# Patient Record
Sex: Female | Born: 1965
Health system: Southern US, Community
[De-identification: ages and names within clinical notes are randomized; demographics above are authoritative.]

## PROBLEM LIST (undated history)

## (undated) DIAGNOSIS — N809 Endometriosis, unspecified: Secondary | ICD-10-CM

## (undated) DIAGNOSIS — D649 Anemia, unspecified: Secondary | ICD-10-CM

## (undated) DIAGNOSIS — C569 Malignant neoplasm of unspecified ovary: Secondary | ICD-10-CM

## (undated) DIAGNOSIS — I10 Essential (primary) hypertension: Secondary | ICD-10-CM

## (undated) HISTORY — PX: ENDOMETRIAL BIOPSY: SHX622

## (undated) HISTORY — DX: Anemia, unspecified: D64.9

## (undated) HISTORY — PX: ABDOMINAL HYSTERECTOMY: SHX81

---

## 2000-10-24 ENCOUNTER — Other Ambulatory Visit: Admission: RE | Admit: 2000-10-24 | Discharge: 2000-10-24 | Payer: Self-pay | Admitting: Family Medicine

## 2004-05-05 ENCOUNTER — Emergency Department: Payer: Self-pay | Admitting: Unknown Physician Specialty

## 2005-06-14 ENCOUNTER — Ambulatory Visit: Payer: Self-pay | Admitting: Gastroenterology

## 2007-06-10 ENCOUNTER — Other Ambulatory Visit: Payer: Self-pay

## 2007-06-10 ENCOUNTER — Emergency Department: Payer: Self-pay | Admitting: Emergency Medicine

## 2009-12-06 ENCOUNTER — Emergency Department: Payer: Self-pay | Admitting: Emergency Medicine

## 2009-12-12 ENCOUNTER — Ambulatory Visit: Payer: Self-pay | Admitting: Family Medicine

## 2010-11-23 ENCOUNTER — Ambulatory Visit: Payer: Self-pay | Admitting: Family Medicine

## 2011-10-08 ENCOUNTER — Ambulatory Visit: Payer: Self-pay | Admitting: Family Medicine

## 2011-10-10 ENCOUNTER — Ambulatory Visit: Payer: Self-pay | Admitting: Family Medicine

## 2011-11-08 DIAGNOSIS — N806 Endometriosis in cutaneous scar: Secondary | ICD-10-CM | POA: Insufficient documentation

## 2012-05-28 DIAGNOSIS — K30 Functional dyspepsia: Secondary | ICD-10-CM | POA: Insufficient documentation

## 2012-05-28 DIAGNOSIS — Z9889 Other specified postprocedural states: Secondary | ICD-10-CM | POA: Insufficient documentation

## 2012-06-11 DIAGNOSIS — K5900 Constipation, unspecified: Secondary | ICD-10-CM | POA: Insufficient documentation

## 2012-07-07 DIAGNOSIS — C44509 Unspecified malignant neoplasm of skin of other part of trunk: Secondary | ICD-10-CM | POA: Insufficient documentation

## 2012-07-07 DIAGNOSIS — C482 Malignant neoplasm of peritoneum, unspecified: Secondary | ICD-10-CM | POA: Insufficient documentation

## 2013-05-28 LAB — LIPID PANEL
Cholesterol: 181 mg/dL (ref 0–200)
HDL: 49 mg/dL (ref 35–70)
LDL Cholesterol: 106 mg/dL
Triglycerides: 130 mg/dL (ref 40–160)

## 2013-05-28 LAB — BASIC METABOLIC PANEL
Creatinine: 0.9 mg/dL (ref 0.5–1.1)
Glucose: 108 mg/dL

## 2014-12-03 ENCOUNTER — Ambulatory Visit (INDEPENDENT_AMBULATORY_CARE_PROVIDER_SITE_OTHER): Payer: BLUE CROSS/BLUE SHIELD | Admitting: Family Medicine

## 2014-12-03 ENCOUNTER — Encounter: Payer: Self-pay | Admitting: Family Medicine

## 2014-12-03 VITALS — BP 112/82 | HR 93 | Temp 98.7°F | Resp 16 | Wt 222.6 lb

## 2014-12-03 DIAGNOSIS — M779 Enthesopathy, unspecified: Secondary | ICD-10-CM

## 2014-12-03 DIAGNOSIS — C569 Malignant neoplasm of unspecified ovary: Secondary | ICD-10-CM

## 2014-12-03 DIAGNOSIS — Z1231 Encounter for screening mammogram for malignant neoplasm of breast: Secondary | ICD-10-CM | POA: Diagnosis not present

## 2014-12-03 DIAGNOSIS — I10 Essential (primary) hypertension: Secondary | ICD-10-CM | POA: Diagnosis not present

## 2014-12-03 NOTE — Progress Notes (Signed)
Subjective:     Patient ID: Michelle Shea, female   DOB: 09/12/1965, 49 y.o.   MRN: [id number]  HPI  Chief Complaint  Patient presents with  . Arm Pain    patient comes in office today with complaints of right arm pain for the psat two weeks. 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   Also wishes f/u of hypertension and to set up a screening mammogram. Continues to be followed by [facility] for clear cell carcinoma of her anterior abdominal wall and ovarian cancer.   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       Objective:   Physical Exam  Constitutional: She appears well-developed and well-nourished. No distress.  Cardiovascular: Normal rate and regular rhythm.   Pulmonary/Chest: Breath sounds normal.  Musculoskeletal: She exhibits no edema.  Tender over right proximal forearm with increased pain with wrist extension and supination/pronation. Grip/EF/EE 5/5.       Assessment:    1. Essential hypertension - Renal function panel  2. Tendonitis  3. Encounter for screening mammogram for breast cancer - MM DIGITAL SCREENING BILATERAL; Future    Plan:    Discussed icing her arm after work and to schedule nsaid's. She will minimize pulling with her right arm.

## 2014-12-03 NOTE — Patient Instructions (Addendum)
Discussed icing arm for 20 minutes after work. Start two aleve twice daily with food. We will call you with lab work.

## 2014-12-04 LAB — RENAL FUNCTION PANEL
Albumin: 4.3 g/dL (ref 3.5–5.5)
BUN / CREAT RATIO: 16 (ref 9–23)
BUN: 14 mg/dL (ref 6–24)
CHLORIDE: 99 mmol/L (ref 97–108)
CO2: 27 mmol/L (ref 18–29)
Calcium: 10 mg/dL (ref 8.7–10.2)
Creatinine, Ser: 0.86 mg/dL (ref 0.57–1.00)
GFR, EST AFRICAN AMERICAN: 92 mL/min/{1.73_m2} (ref >59)
GFR, EST NON AFRICAN AMERICAN: 80 mL/min/{1.73_m2} (ref >59)
GLUCOSE: 94 mg/dL (ref 65–99)
POTASSIUM: 4.4 mmol/L (ref 3.5–5.2)
Phosphorus: 4.5 mg/dL (ref 2.5–4.5)
Sodium: 143 mmol/L (ref 134–144)

## 2014-12-06 ENCOUNTER — Telehealth: Payer: Self-pay

## 2014-12-06 NOTE — Telephone Encounter (Signed)
Patient has been advised of lab report. KW 

## 2014-12-06 NOTE — Telephone Encounter (Signed)
-----   Message from Carmon Ginsberg, [place] sent at 12/06/2014  7:50 AM EDT ----- Kidney function and sugar good.

## 2015-01-17 ENCOUNTER — Ambulatory Visit (INDEPENDENT_AMBULATORY_CARE_PROVIDER_SITE_OTHER): Payer: BLUE CROSS/BLUE SHIELD | Admitting: Family Medicine

## 2015-01-17 ENCOUNTER — Encounter: Payer: Self-pay | Admitting: Family Medicine

## 2015-01-17 VITALS — BP 160/90 | HR 90 | Temp 98.7°F | Resp 16 | Wt 230.0 lb

## 2015-01-17 DIAGNOSIS — I10 Essential (primary) hypertension: Secondary | ICD-10-CM | POA: Diagnosis not present

## 2015-01-17 MED ORDER — CHLORTHALIDONE 25 MG PO TABS: 25.0000 mg | ORAL_TABLET | Freq: Every day | ORAL | Status: DC

## 2015-01-17 NOTE — Patient Instructions (Signed)
Remember to take your blood pressure every day.

## 2015-01-17 NOTE — Progress Notes (Signed)
Subjective:     Patient ID: Michelle Shea, female   DOB: [birth year], 49 y.o.   MRN: [id number]  HPI  Chief Complaint  Patient presents with  . Hypertension  States she got angry at her boyfriend who works with her 10/29 then developed dizziness and feeling like she was going to pass out. Blood pressure was 200/130 at that time. Reports poor compliance with HCTZ though has taken it daily over the last few days.   Review of Systems     Objective:   Physical Exam  Constitutional: She appears well-developed and well-nourished. No distress.  Neck: Carotid bruit is not present.  Cardiovascular: Normal rate and regular rhythm.   Pulmonary/Chest: Breath sounds normal.  Musculoskeletal: She exhibits no edema (of lower extremities).       Assessment:    1. Essential hypertension: poor compliance with medication - chlorthalidone (HYGROTON) 25 MG tablet; Take 1 tablet (25 mg total) by mouth daily.  Dispense: 30 tablet; Refill: 0    Plan:    Stressed compliance with medication with f/u in 2 weeks.

## 2015-01-31 ENCOUNTER — Ambulatory Visit: Payer: BLUE CROSS/BLUE SHIELD | Admitting: Family Medicine

## 2015-02-24 ENCOUNTER — Other Ambulatory Visit: Payer: Self-pay | Admitting: Family Medicine

## 2015-02-24 ENCOUNTER — Telehealth: Payer: Self-pay | Admitting: Family Medicine

## 2015-02-24 DIAGNOSIS — I10 Essential (primary) hypertension: Secondary | ICD-10-CM

## 2015-02-24 MED ORDER — CHLORTHALIDONE 25 MG PO TABS: 25.0000 mg | ORAL_TABLET | Freq: Every day | ORAL | Status: DC

## 2015-02-24 NOTE — Telephone Encounter (Signed)
Pt contacted office for refill request on the following medications:  chlorthalidone (HYGROTON) 25 MG tablet.  [place] [facility]  CB#[id number]  Pt rescheduled a follow up appointment for 03/04/2015@11 :00/MW

## 2015-02-24 NOTE — Telephone Encounter (Signed)
Refill Request.  

## 2015-02-24 NOTE — Telephone Encounter (Signed)
Refilled pending appointment next week.

## 2015-03-04 ENCOUNTER — Ambulatory Visit: Payer: BLUE CROSS/BLUE SHIELD | Admitting: Family Medicine

## 2015-04-26 ENCOUNTER — Other Ambulatory Visit: Payer: Self-pay | Admitting: Family Medicine

## 2015-04-26 ENCOUNTER — Telehealth: Payer: Self-pay | Admitting: Family Medicine

## 2015-04-26 DIAGNOSIS — I10 Essential (primary) hypertension: Secondary | ICD-10-CM

## 2015-04-26 MED ORDER — CHLORTHALIDONE 25 MG PO TABS: 25.0000 mg | ORAL_TABLET | Freq: Every day | ORAL | Status: DC

## 2015-04-26 NOTE — Telephone Encounter (Signed)
Refill request. KW 

## 2015-04-26 NOTE — Telephone Encounter (Signed)
Pt needs refill chlorthalidone (HYGROTON) 25 MG tablet   She uses [facility]   Pt's call back is [phone]  Thanks teri

## 2015-06-15 ENCOUNTER — Other Ambulatory Visit: Payer: Self-pay | Admitting: Family Medicine

## 2015-06-15 DIAGNOSIS — I10 Essential (primary) hypertension: Secondary | ICD-10-CM

## 2015-06-15 MED ORDER — CHLORTHALIDONE 25 MG PO TABS
25.0000 mg | ORAL_TABLET | Freq: Every day | ORAL | Status: DC
Start: 2015-06-15 — End: 2015-10-28

## 2015-06-15 NOTE — Telephone Encounter (Signed)
Pt is out of her blood pressure medication.  Mikki Santee had told her to make an appt to come in before refilling.  She called today and she may come in Monday but she is out now of the chlorthalidone (HYGROTON) 25 MG tablet  She ask that you call her back at [phone]  Thank sTeri

## 2015-10-28 ENCOUNTER — Telehealth: Payer: Self-pay

## 2015-10-28 ENCOUNTER — Other Ambulatory Visit: Payer: Self-pay | Admitting: Family Medicine

## 2015-10-28 DIAGNOSIS — I10 Essential (primary) hypertension: Secondary | ICD-10-CM

## 2015-10-28 MED ORDER — CHLORTHALIDONE 25 MG PO TABS: 25.0000 mg | ORAL_TABLET | Freq: Every day | ORAL | 0 refills | Status: DC

## 2015-10-28 NOTE — Telephone Encounter (Signed)
Patient called saying that she is completely out of her medication. She is requesting a refill on chlorthalidone (HYGROTON) 25 MG tablet. Patient also scheduled an appt for a follow up.  Thanks!

## 2015-10-28 NOTE — Telephone Encounter (Signed)
Medication refilled

## 2015-11-03 ENCOUNTER — Ambulatory Visit (INDEPENDENT_AMBULATORY_CARE_PROVIDER_SITE_OTHER): Payer: BLUE CROSS/BLUE SHIELD | Admitting: Family Medicine

## 2015-11-03 ENCOUNTER — Encounter: Payer: Self-pay | Admitting: Family Medicine

## 2015-11-03 VITALS — BP 118/70 | HR 76 | Temp 99.2°F | Resp 16 | Wt 214.8 lb

## 2015-11-03 DIAGNOSIS — Z13220 Encounter for screening for lipoid disorders: Secondary | ICD-10-CM

## 2015-11-03 DIAGNOSIS — I10 Essential (primary) hypertension: Secondary | ICD-10-CM

## 2015-11-03 DIAGNOSIS — Z862 Personal history of diseases of the blood and blood-forming organs and certain disorders involving the immune mechanism: Secondary | ICD-10-CM

## 2015-11-03 MED ORDER — CHLORTHALIDONE 25 MG PO TABS: 25.0000 mg | ORAL_TABLET | Freq: Every day | ORAL | 1 refills | Status: DC

## 2015-11-03 NOTE — Patient Instructions (Signed)
Don't forget to get your labs. We will call you with the lab results.

## 2015-11-03 NOTE — Progress Notes (Signed)
Subjective:     Patient ID: ANGALINA ROYE, female   DOB: Jul 08, 1965, 50 y.o.   MRN: [id number]  HPI  Chief Complaint  Patient presents with  . Hypertension    Patient comes in office today for follow up visit, last visit was 10/31/116. At last visit patient reported poor compliance on medication, she was started back on Chlorthalidone 25mg   and advised to follow up in two weeks. Blood pressure last visit was 160/90, patient reports today fair compliance on medication and good tolerance.   States she continues to see [facility] annually for her ovarian cancer. She will be scheduling a mammogram at [place]. Accompanied by her daughter today. Wishes to recheck her iron levels due to hx of anemia and current fatigue.   Review of Systems  Respiratory: Negative for shortness of breath.   Cardiovascular: Negative for chest pain and palpitations.       Objective:   Physical Exam  Constitutional: She appears well-developed and well-nourished. No distress.  Cardiovascular: Normal rate and regular rhythm.   Pulmonary/Chest: Breath sounds normal.  Musculoskeletal: She exhibits no edema (of lower extremities).       Assessment:    1. Essential hypertension - Comprehensive metabolic panel - chlorthalidone (HYGROTON) 25 MG tablet; Take 1 tablet (25 mg total) by mouth daily.  Dispense: 90 tablet; Refill: 1  2. History of anemia - CBC with Differential/Platelet - Ferritin  3. Screening for cholesterol level - Lipid panel    Plan:    Further f/u pending lab work.

## 2016-06-25 ENCOUNTER — Ambulatory Visit (INDEPENDENT_AMBULATORY_CARE_PROVIDER_SITE_OTHER): Payer: BLUE CROSS/BLUE SHIELD | Admitting: Family Medicine

## 2016-06-25 ENCOUNTER — Encounter: Payer: Self-pay | Admitting: Family Medicine

## 2016-06-25 VITALS — BP 110/70 | HR 74 | Temp 98.4°F | Resp 16 | Wt 219.2 lb

## 2016-06-25 DIAGNOSIS — Z13220 Encounter for screening for lipoid disorders: Secondary | ICD-10-CM | POA: Diagnosis not present

## 2016-06-25 DIAGNOSIS — I10 Essential (primary) hypertension: Secondary | ICD-10-CM

## 2016-06-25 DIAGNOSIS — H1031 Unspecified acute conjunctivitis, right eye: Secondary | ICD-10-CM | POA: Diagnosis not present

## 2016-06-25 MED ORDER — SULFACETAMIDE SODIUM 10 % OP SOLN: 2.0000 [drp] | Freq: Four times a day (QID) | OPHTHALMIC | 0 refills | Status: DC

## 2016-06-25 NOTE — Progress Notes (Signed)
Subjective:     Patient ID: Michelle Shea, female   DOB: 09-May-1965, 50 y.o.   MRN: [id number]  HPI  Chief Complaint  Patient presents with  . Eye Problem    Patient comes in office today with concerns of pink eye exposure. Patient states for the past 24hrs she has had redness and itching of her right eye, when she woke this morning she reported seeing discharge from eye.   States left eye was watery first but improved spontaneously. No change in vision or contact lens use. Also here to f/u hypertension.   Review of Systems  Respiratory: Negative for shortness of breath.   Cardiovascular: Negative for chest pain and palpitations.  Musculoskeletal:       Reports leg cramps       Objective:   Physical Exam  Constitutional: She appears well-developed and well-nourished.  Eyes:  Pupils equal, right scleral with mild injection and small amount of purulent discharge in medial canthus  Cardiovascular: Normal rate and regular rhythm.   Pulmonary/Chest: Breath sounds normal.  Musculoskeletal: She exhibits no edema (of lower extremties).       Assessment:    1. Essential hypertension - Comprehensive metabolic panel  2. Acute bacterial conjunctivitis of right eye - sulfacetamide (BLEPH-10) 10 % ophthalmic solution; Place 2 drops into the right eye 4 (four) times daily.  Dispense: 15 mL; Refill: 0  3. Screening for cholesterol level - Lipid panel    Plan:    Discussed warm compresses and use of abx drops if eye not improving in 24 hours. Further f/u pending lab work.

## 2016-06-25 NOTE — Patient Instructions (Signed)
Try warm, wet, compresses to your right eye several x day. If not improving by tomorrow AM start the eye drops. We will call you with the lab results.

## 2016-08-13 ENCOUNTER — Other Ambulatory Visit: Payer: Self-pay | Admitting: Family Medicine

## 2016-08-13 DIAGNOSIS — I10 Essential (primary) hypertension: Secondary | ICD-10-CM

## 2016-12-20 ENCOUNTER — Encounter: Payer: Self-pay | Admitting: Emergency Medicine

## 2016-12-20 ENCOUNTER — Emergency Department: Payer: Worker's Compensation

## 2016-12-20 ENCOUNTER — Emergency Department
Admission: EM | Admit: 2016-12-20 | Discharge: 2016-12-20 | Disposition: A | Discharge status: Home / Self Care | Payer: Worker's Compensation | Attending: Emergency Medicine | Admitting: Emergency Medicine

## 2016-12-20 DIAGNOSIS — Y929 Unspecified place or not applicable: Secondary | ICD-10-CM | POA: Diagnosis not present

## 2016-12-20 DIAGNOSIS — S52591A Other fractures of lower end of right radius, initial encounter for closed fracture: Secondary | ICD-10-CM | POA: Insufficient documentation

## 2016-12-20 DIAGNOSIS — Y998 Other external cause status: Secondary | ICD-10-CM | POA: Diagnosis not present

## 2016-12-20 DIAGNOSIS — Y9389 Activity, other specified: Secondary | ICD-10-CM | POA: Diagnosis not present

## 2016-12-20 DIAGNOSIS — Z79899 Other long term (current) drug therapy: Secondary | ICD-10-CM | POA: Insufficient documentation

## 2016-12-20 DIAGNOSIS — Z8589 Personal history of malignant neoplasm of other organs and systems: Secondary | ICD-10-CM | POA: Insufficient documentation

## 2016-12-20 DIAGNOSIS — Z8543 Personal history of malignant neoplasm of ovary: Secondary | ICD-10-CM | POA: Diagnosis not present

## 2016-12-20 DIAGNOSIS — S6991XA Unspecified injury of right wrist, hand and finger(s), initial encounter: Secondary | ICD-10-CM | POA: Diagnosis present

## 2016-12-20 DIAGNOSIS — I10 Essential (primary) hypertension: Secondary | ICD-10-CM | POA: Diagnosis not present

## 2016-12-20 DIAGNOSIS — W010XXA Fall on same level from slipping, tripping and stumbling without subsequent striking against object, initial encounter: Secondary | ICD-10-CM | POA: Diagnosis not present

## 2016-12-20 DIAGNOSIS — Z7982 Long term (current) use of aspirin: Secondary | ICD-10-CM | POA: Diagnosis not present

## 2016-12-20 HISTORY — DX: Essential (primary) hypertension: I10

## 2016-12-20 HISTORY — DX: Malignant neoplasm of unspecified ovary: C56.9

## 2016-12-20 HISTORY — DX: Endometriosis, unspecified: N80.9

## 2016-12-20 MED ORDER — IBUPROFEN 800 MG PO TABS: 800.0000 mg | ORAL_TABLET | Freq: Three times a day (TID) | ORAL | 0 refills | Status: DC | PRN

## 2016-12-20 MED ORDER — HYDROCODONE-ACETAMINOPHEN 5-325 MG PO TABS
1.0000 | ORAL_TABLET | Freq: Four times a day (QID) | ORAL | 0 refills | Status: DC | PRN
Start: 2016-12-20 — End: 2017-01-15

## 2016-12-20 MED ORDER — IBUPROFEN 800 MG PO TABS
800.0000 mg | ORAL_TABLET | Freq: Once | ORAL | Status: AC
  Administered 2016-12-20: 800 mg via ORAL
  Filled 2016-12-20: qty 1

## 2016-12-20 NOTE — ED Provider Notes (Signed)
[facility] Emergency Department Provider Note   ____________________________________________   First MD Initiated Contact with Patient 12/20/16 [phone]     (approximate)  I have reviewed the triage vital signs and the nursing notes.   HISTORY  Chief Complaint Wrist Pain    HPI Michelle Shea is a 51 y.o. female who presents to the ED from home with a chief complaint of fall with right wrist injury. Patient had a [place] type injury yesterday afternoon approximately 4 PM.Denies striking head or LOC. Complains of swelling and pain. Denies associated weakness, numbness or tingling. Denies other injuries. Patient is right-hand dominant.   Past Medical History:  Diagnosis Date  . Anemia   . Endometriosis   . Hypertension   . Ovarian cancer [facility])     Patient Active Problem List   Diagnosis Date Noted  . History of anemia 11/03/2015  . Ovarian cancer ([place]) 12/03/2014  . Cancer of abdominal wall 07/07/2012  . Malignant neoplasm of peritoneum ([place]) 07/07/2012  . Other specified postprocedural states 05/28/2012  . BP (high blood pressure) 05/19/2012  . Endometriosis in cutaneous scar 11/08/2011    Past Surgical History:  Procedure Laterality Date  . ENDOMETRIAL BIOPSY      Prior to Admission medications   Medication Sig Start Date End Date Taking? Authorizing Provider  acetaminophen (TYLENOL) 325 MG tablet Take by mouth.    [provider]  aspirin 81 MG chewable tablet Chew by mouth.    [provider]  chlorthalidone (HYGROTON) 25 MG tablet Take 1 tablet (25 mg total) by mouth daily. 08/14/16   Carmon Ginsberg, PA  ferrous sulfate 325 (65 FE) MG tablet Take by mouth.    [provider]  HYDROcodone-acetaminophen (NORCO) 5-325 MG tablet Take 1 tablet by mouth every 6 (six) hours as needed for moderate pain. 12/20/16   Paulette Blanch, MD  ibuprofen (ADVIL,MOTRIN) 800 MG tablet Take 1 tablet (800 mg total) by mouth every 8  (eight) hours as needed for moderate pain. 12/20/16   Paulette Blanch, MD  sulfacetamide (BLEPH-10) 10 % ophthalmic solution Place 2 drops into the right eye 4 (four) times daily. 06/25/16   Carmon Ginsberg, PA    Allergies Patient has no known allergies.  No family history on file.  Social History Social History  Substance Use Topics  . Smoking status: Never Smoker  . Smokeless tobacco: Never Used  . Alcohol use 0.0 oz/week     Comment: sometimes    Review of Systems  Constitutional: No fever/chills. Eyes: No visual changes. ENT: No sore throat. Cardiovascular: Denies chest pain. Respiratory: Denies shortness of breath. Gastrointestinal: No abdominal pain.  No nausea, no vomiting.  No diarrhea.  No constipation. Genitourinary: Negative for dysuria. Musculoskeletal: Positive for right wrist pain. Negative for back pain. Skin: Negative for rash. Neurological: Negative for headaches, focal weakness or numbness.   ____________________________________________   PHYSICAL EXAM:  VITAL SIGNS: ED Triage Vitals [12/20/16 0508]  Enc Vitals Group     BP      Pulse Rate 63     Resp 20     Temp 98 F (36.7 C)     Temp Source Oral     SpO2 99 %     Weight 208 lb (94.3 kg)     Height 5\' 5"  (1.651 m)     Head Circumference      Peak Flow      Pain Score 10     Pain  Loc      Pain Edu?      Excl. in [place]?     Constitutional: Alert and oriented. Well appearing and in no acute distress. Eyes: Conjunctivae are normal. PERRL. EOMI. Head: Atraumatic. Nose: No congestion/rhinnorhea. Mouth/Throat: Mucous membranes are moist.  Oropharynx non-erythematous. Neck: No stridor.  No cervical tenderness to palpation. Cardiovascular: Normal rate, regular rhythm. Grossly normal heart sounds.  Good peripheral circulation. Respiratory: Normal respiratory effort.  No retractions. Lungs CTAB. Gastrointestinal: Soft and nontender. No distention. No abdominal bruits. No CVA  tenderness. Musculoskeletal:  Right wrist with mild swelling. Decreased range of motion secondary to pain. 2+ radial pulses. Brisk, less than 5 second capillary refill. 5/5 motor strength and sensation. Neurologic:  Normal speech and language. No gross focal neurologic deficits are appreciated. No gait instability. Skin:  Skin is warm, dry and intact. No rash noted. Psychiatric: Mood and affect are normal. Speech and behavior are normal.  ____________________________________________   LABS (all labs ordered are listed, but only abnormal results are displayed)  Labs Reviewed - No data to display ____________________________________________  EKG  None ____________________________________________  RADIOLOGY  Dg Wrist Complete Right  Result Date: 12/20/2016 CLINICAL DATA:  Painful right wrist after fall yesterday. EXAM: RIGHT WRIST - COMPLETE 3+ VIEW COMPARISON:  None. FINDINGS: Comminuted fractures of the distal right radial metaphysis. Fracture lines extend to the radiocarpal and radioulnar joint. Mild impaction of fracture fragments without significant displacement or angulation. Ulnar styloid process appears intact. Soft tissue swelling. IMPRESSION: Comminuted fractures of the distal right radial metaphysis. Electronically Signed   By: Lucienne Capers M.D.   On: 12/20/2016 05:37    ____________________________________________   PROCEDURES  Procedure(s) performed:   SPLINT APPLICATION Date/Time: 6:81 AM Authorized by: Paulette Blanch Consent: Verbal consent obtained. Risks and benefits: risks, benefits and alternatives were discussed Consent given by: patient Splint applied by: ED technician Location details: right wrist Splint type: thumb spica Supplies used: OCL Post-procedure: The splinted body part was neurovascularly unchanged following the procedure. Patient tolerance: Patient tolerated the procedure well with no immediate complications.    Procedures  Critical  Care performed: No  ____________________________________________   INITIAL IMPRESSION / ASSESSMENT AND PLAN / ED COURSE  @[email or web address] @  51 year old female who presents with right wrist injury status post mechanical fall 26 hours ago. X-rays demonstrate distal radius fracture. Will place in splint,  RICE and follow-up with orthopedics. Strict return precautions given. Patient verbalizes understanding and agrees with plan of care.      ____________________________________________   FINAL CLINICAL IMPRESSION(S) / ED DIAGNOSES  Final diagnoses:  Other closed fracture of distal end of right radius, initial encounter      NEW MEDICATIONS STARTED DURING THIS VISIT:  New Prescriptions   HYDROCODONE-ACETAMINOPHEN (NORCO) 5-325 MG TABLET    Take 1 tablet by mouth every 6 (six) hours as needed for moderate pain.   IBUPROFEN (ADVIL,MOTRIN) 800 MG TABLET    Take 1 tablet (800 mg total) by mouth every 8 (eight) hours as needed for moderate pain.     Note:  This document was prepared using Dragon voice recognition software and may include unintentional dictation errors.    Paulette Blanch, MD 12/20/16 [phone]

## 2016-12-20 NOTE — Discharge Instructions (Signed)
1. You may take pain medicines as needed (Motrin/Norco #15). 2. Keep splint clean and dry. 3. Elevate affected area and apply ice over splint several times daily to reduce swelling. 4. You may wear sling as needed for comfort. 5. Return to the ER for worsening symptoms, persistent vomiting, difficulty breathing or other concerns.

## 2016-12-20 NOTE — ED Triage Notes (Signed)
Pt presents to ED with painful right wrist. Pt states she tripped and fell yesterday while in her home and when she fell back she attempted to catch herself with her right hand. +movement and sensation; swelling noted. Ice applied at home.

## 2017-01-10 DIAGNOSIS — K439 Ventral hernia without obstruction or gangrene: Secondary | ICD-10-CM | POA: Insufficient documentation

## 2017-01-14 ENCOUNTER — Telehealth: Payer: Self-pay | Admitting: Family Medicine

## 2017-01-14 NOTE — Telephone Encounter (Signed)
Also tried calling both numbers; no answer of her cell phone. Called work number, and I was informed that pt is is in a meeting. Asked for her to call back when available.

## 2017-01-14 NOTE — Telephone Encounter (Signed)
Patient called back she reports that she was at work. Explained that other medical assistant where trying to get a hold of her and patient reports that it was her daughter who had called to get the an appointment.  Per patient she has been having the dizziness daily for the past month.It only happens when she gets up from laying down. She denies nausea,vomiting, headache,visual disturbances,chest pain or SOB. Per patient she has had Vertigo in the past. Patient was advised of the appointment that was scheduled by her daughter and she is going to keep that appointment for tomorrow.  Thanks,  -Joseline

## 2017-01-14 NOTE — Telephone Encounter (Signed)
Tried calling both contact numbers.  Pt's voice mail is not set up on her Cell phone.  I also tried calling her work number but there was no answer.   Thanks,   -Mickel Baas

## 2017-01-14 NOTE — Telephone Encounter (Signed)
Pt called to get appt for dizzy spells. Pt stated couldn't come in today. Pt is scheduled for tomorrow 01/15/17 with Mikki Santee. When I put pt on hold to speak with triage nurse pt hung up. I spoke with triage nurse Mickel Baas and Mickel Baas advised she will try to contact pt. I looked on caller ID and pt called from unknown number. Please advise. Thanks TNP

## 2017-01-15 ENCOUNTER — Ambulatory Visit (INDEPENDENT_AMBULATORY_CARE_PROVIDER_SITE_OTHER): Payer: BLUE CROSS/BLUE SHIELD | Admitting: Family Medicine

## 2017-01-15 ENCOUNTER — Encounter: Payer: Self-pay | Admitting: Family Medicine

## 2017-01-15 VITALS — BP 134/100 | HR 64 | Temp 98.1°F | Resp 16 | Wt 216.0 lb

## 2017-01-15 DIAGNOSIS — R42 Dizziness and giddiness: Secondary | ICD-10-CM | POA: Diagnosis not present

## 2017-01-15 DIAGNOSIS — I10 Essential (primary) hypertension: Secondary | ICD-10-CM | POA: Diagnosis not present

## 2017-01-15 LAB — CBC WITH DIFFERENTIAL/PLATELET
BASOS ABS: 48 {cells}/uL (ref 0–200)
Basophils Relative: 0.7 %
EOS PCT: 3.4 %
Eosinophils Absolute: 231 cells/uL (ref 15–500)
HEMATOCRIT: 35.1 % (ref 35.0–45.0)
HEMOGLOBIN: 11.8 g/dL (ref 11.7–15.5)
Lymphs Abs: 2237 cells/uL (ref 850–3900)
MCH: 28.6 pg (ref 27.0–33.0)
MCHC: 33.6 g/dL (ref 32.0–36.0)
MCV: 85.2 fL (ref 80.0–100.0)
MPV: 11 fL (ref 7.5–12.5)
Monocytes Relative: 7 %
NEUTROS ABS: 3808 {cells}/uL (ref 1500–7800)
Neutrophils Relative %: 56 %
Platelets: 225 10*3/uL (ref 140–400)
RBC: 4.12 10*6/uL (ref 3.80–5.10)
RDW: 13.4 % (ref 11.0–15.0)
Total Lymphocyte: 32.9 %
WBC: 6.8 10*3/uL (ref 3.8–10.8)
WBCMIX: 476 {cells}/uL (ref 200–950)

## 2017-01-15 LAB — COMPLETE METABOLIC PANEL WITH GFR
AG Ratio: 1.4 (calc) (ref 1.0–2.5)
ALT: 12 U/L (ref 6–29)
AST: 15 U/L (ref 10–35)
Albumin: 3.9 g/dL (ref 3.6–5.1)
Alkaline phosphatase (APISO): 96 U/L (ref 33–130)
BUN: 11 mg/dL (ref 7–25)
CALCIUM: 9.2 mg/dL (ref 8.6–10.4)
CO2: 30 mmol/L (ref 20–32)
CREATININE: 0.73 mg/dL (ref 0.50–1.05)
Chloride: 103 mmol/L (ref 98–110)
GFR, EST AFRICAN AMERICAN: 111 mL/min/{1.73_m2} (ref >=60)
GFR, EST NON AFRICAN AMERICAN: 95 mL/min/{1.73_m2} (ref >=60)
GLUCOSE: 93 mg/dL (ref 65–99)
Globulin: 2.7 g/dL (calc) (ref 1.9–3.7)
Potassium: 3.5 mmol/L (ref 3.5–5.3)
Sodium: 141 mmol/L (ref 135–146)
TOTAL PROTEIN: 6.6 g/dL (ref 6.1–8.1)
Total Bilirubin: 0.5 mg/dL (ref 0.2–1.2)

## 2017-01-15 NOTE — Progress Notes (Signed)
Subjective:     Patient ID: Michelle Shea, female   DOB: Dec 31, 1965, 51 y.o.   MRN: [id number]  HPI  Chief Complaint  Patient presents with  . Dizziness    Patient comes in orffice today with complaints of dizziness intermittent for the past month. Patient states that she has dizziness when laying dowing and sitting/standingv suddenly or when bending down.   Reports it as an unsteadiness when she changes positions but mild spinning when she lays down in bed. Reports compliance with bp medication and has continued to take one iron pill daily. She has had TAH and bilateral salping-oophorectomy.   Review of Systems     Objective:   Physical Exam  HENT:  Ear canals patent. TM's normal  Eyes: Pupils are equal, round, and reactive to light. EOM are normal.  Neck: Carotid bruit is not present.  Cardiovascular: Normal rate and regular rhythm.   Pulmonary/Chest: Breath sounds normal.  Musculoskeletal: She exhibits no edema (of lower extremities).  Neurological: Coordination (heel to toe and Romberg negative) normal.       Assessment:    1. Dizziness - COMPLETE METABOLIC PANEL WITH GFR - CBC with Differential/Platelet  2. Essential hypertension - COMPLETE METABOLIC PANEL WITH GFR    Plan:    If labs normal; add bp medication for improved control. Consider ENT referral if dizziness persists.

## 2017-01-15 NOTE — Patient Instructions (Signed)
We will call you with the lab results. If labs ok will add another bp medication.

## 2017-01-16 ENCOUNTER — Other Ambulatory Visit: Payer: Self-pay | Admitting: Family Medicine

## 2017-01-16 ENCOUNTER — Telehealth: Payer: Self-pay

## 2017-01-16 MED ORDER — AMLODIPINE BESYLATE 5 MG PO TABS: 5.0000 mg | ORAL_TABLET | Freq: Every day | ORAL | 0 refills | Status: DC

## 2017-01-16 NOTE — Telephone Encounter (Signed)
Pt advised of results and FU made.

## 2017-01-16 NOTE — Telephone Encounter (Signed)
-----   Message from Carmon Ginsberg, [place] sent at 01/16/2017  7:30 AM EDT ----- Your labs are good, no anemia. Stop the iron. Will add a low dose of a second blood pressure medication. Follow up with me in 2 weeks.

## 2017-01-25 LAB — HM MAMMOGRAPHY

## 2017-01-29 ENCOUNTER — Ambulatory Visit: Payer: Self-pay | Admitting: Family Medicine

## 2017-03-15 ENCOUNTER — Other Ambulatory Visit: Payer: Self-pay | Admitting: Family Medicine

## 2017-03-15 ENCOUNTER — Telehealth: Payer: Self-pay | Admitting: Family Medicine

## 2017-03-15 ENCOUNTER — Encounter: Payer: Self-pay | Admitting: Family Medicine

## 2017-03-15 MED ORDER — AMLODIPINE BESYLATE 5 MG PO TABS: 5.0000 mg | ORAL_TABLET | Freq: Every day | ORAL | 0 refills | Status: DC

## 2017-03-15 NOTE — Telephone Encounter (Signed)
Please advise-Michelle Shea, RMA  

## 2017-03-15 NOTE — Telephone Encounter (Signed)
I have refilled amlodipine for a month. Have her come in for a bp check after she has been on it for two weeks

## 2017-03-15 NOTE — Telephone Encounter (Signed)
Patient advised as directed below.  Thanks,  -Kei Langhorst 

## 2017-03-15 NOTE — Telephone Encounter (Signed)
Pt needs refill of Amlodipine 5 MG sent to [facility] on [address].

## 2017-05-03 ENCOUNTER — Ambulatory Visit: Payer: BLUE CROSS/BLUE SHIELD | Admitting: Family Medicine

## 2017-05-03 ENCOUNTER — Encounter: Payer: Self-pay | Admitting: Family Medicine

## 2017-05-03 VITALS — BP 118/88 | HR 80 | Temp 99.1°F | Resp 16 | Wt 213.0 lb

## 2017-05-03 DIAGNOSIS — I10 Essential (primary) hypertension: Secondary | ICD-10-CM

## 2017-05-03 DIAGNOSIS — Z13220 Encounter for screening for lipoid disorders: Secondary | ICD-10-CM

## 2017-05-03 MED ORDER — AMLODIPINE BESYLATE 5 MG PO TABS: 5.0000 mg | ORAL_TABLET | Freq: Every day | ORAL | 3 refills | Status: DC

## 2017-05-03 NOTE — Patient Instructions (Signed)
We will call you with the lab result. Do reschedule your colonoscopy.

## 2017-05-03 NOTE — Progress Notes (Signed)
Subjective:     Patient ID: Michelle Shea, female   DOB: March 06, 1966, 52 y.o.   MRN: [id number] HPI Chief Complaint  Patient presents with  . Hypertension    Patient comes in office today for hypertension follow up, patients last office visit was 01/15/17 blood pressure at that visit was 134/100. Last blood pressure check in office was 03/15/17 blood pressure was 120/80, patient reports good compliance and tolerance on medication. Patient states that she ran out of medication early this week and will need refill today.   States she ran out of amlodipine but has kept on the chlorthalidone. Defers flu shot. States that she has had amammogram and colonoscopy scheduled by [facility] who follow her abdominal wall cancer.   Review of Systems  Respiratory: Negative for shortness of breath.   Cardiovascular: Negative for chest pain and palpitations.       Objective:   Physical Exam  Constitutional: She appears well-developed and well-nourished. No distress.  Cardiovascular: Normal rate and regular rhythm.  Pulmonary/Chest: Breath sounds normal.  Musculoskeletal: She exhibits no edema (of lower extremities).       Assessment:    1. Essential hypertension; continue chlorthalidone - amLODipine (NORVASC) 5 MG tablet; Take 1 tablet (5 mg total) by mouth daily.  Dispense: 90 tablet; Refill: 3  2. Screening for cholesterol level - Lipid panel    Plan:    Further f/u pending lab work.

## 2017-10-01 ENCOUNTER — Other Ambulatory Visit: Payer: Self-pay | Admitting: Family Medicine

## 2017-10-01 DIAGNOSIS — I10 Essential (primary) hypertension: Secondary | ICD-10-CM

## 2017-10-31 ENCOUNTER — Ambulatory Visit: Payer: Self-pay | Admitting: Family Medicine

## 2018-02-07 ENCOUNTER — Telehealth: Payer: Self-pay | Admitting: Family Medicine

## 2018-02-07 ENCOUNTER — Other Ambulatory Visit: Payer: Self-pay | Admitting: Family Medicine

## 2018-02-07 DIAGNOSIS — I10 Essential (primary) hypertension: Secondary | ICD-10-CM

## 2018-02-07 MED ORDER — CHLORTHALIDONE 25 MG PO TABS: 25.0000 mg | ORAL_TABLET | Freq: Every day | ORAL | 3 refills | Status: DC

## 2018-02-07 NOTE — Telephone Encounter (Signed)
Pt needing a refill on:  chlorthalidone (HYGROTON) 25 MG tablet  Please fill at:  [place] ([address]), [place] [address] [phone] (Phone) [phone] (Fax)   Thanks, [employer]

## 2018-02-07 NOTE — Telephone Encounter (Signed)
refilled 

## 2018-02-07 NOTE — Telephone Encounter (Signed)
Last office visit 05/03/17, prescription last filled 10/02/17. KW

## 2018-03-10 DIAGNOSIS — R188 Other ascites: Secondary | ICD-10-CM | POA: Diagnosis not present

## 2018-03-10 DIAGNOSIS — Z1239 Encounter for other screening for malignant neoplasm of breast: Secondary | ICD-10-CM | POA: Diagnosis not present

## 2018-03-10 DIAGNOSIS — C482 Malignant neoplasm of peritoneum, unspecified: Secondary | ICD-10-CM | POA: Diagnosis not present

## 2018-03-10 DIAGNOSIS — Z1231 Encounter for screening mammogram for malignant neoplasm of breast: Secondary | ICD-10-CM | POA: Diagnosis not present

## 2018-03-10 DIAGNOSIS — Z90710 Acquired absence of both cervix and uterus: Secondary | ICD-10-CM | POA: Diagnosis not present

## 2018-03-10 LAB — HM MAMMOGRAPHY

## 2018-05-05 ENCOUNTER — Other Ambulatory Visit: Payer: Self-pay | Admitting: Family Medicine

## 2018-05-05 DIAGNOSIS — I10 Essential (primary) hypertension: Secondary | ICD-10-CM

## 2018-08-21 ENCOUNTER — Other Ambulatory Visit: Payer: Self-pay | Admitting: Family Medicine

## 2018-08-21 DIAGNOSIS — I10 Essential (primary) hypertension: Secondary | ICD-10-CM

## 2018-08-21 NOTE — Telephone Encounter (Signed)
[facility] faxed refill request for the following medications:   amLODipine (NORVASC) 5 MG tablet   Please advise.  

## 2018-08-22 MED ORDER — AMLODIPINE BESYLATE 5 MG PO TABS: 5.0000 mg | ORAL_TABLET | Freq: Every day | ORAL | 0 refills | Status: DC

## 2018-10-02 ENCOUNTER — Ambulatory Visit: Payer: BC Managed Care – PPO | Admitting: Physician Assistant

## 2018-10-02 ENCOUNTER — Other Ambulatory Visit: Payer: Self-pay

## 2018-10-02 ENCOUNTER — Encounter: Payer: Self-pay | Admitting: Physician Assistant

## 2018-10-02 ENCOUNTER — Other Ambulatory Visit: Payer: Self-pay | Admitting: Physician Assistant

## 2018-10-02 VITALS — BP 110/70 | HR 67 | Temp 98.5°F | Resp 16 | Wt 210.0 lb

## 2018-10-02 DIAGNOSIS — Z1211 Encounter for screening for malignant neoplasm of colon: Secondary | ICD-10-CM

## 2018-10-02 DIAGNOSIS — Z0000 Encounter for general adult medical examination without abnormal findings: Secondary | ICD-10-CM

## 2018-10-02 DIAGNOSIS — Z114 Encounter for screening for human immunodeficiency virus [HIV]: Secondary | ICD-10-CM

## 2018-10-02 DIAGNOSIS — C482 Malignant neoplasm of peritoneum, unspecified: Secondary | ICD-10-CM

## 2018-10-02 DIAGNOSIS — I10 Essential (primary) hypertension: Secondary | ICD-10-CM | POA: Diagnosis not present

## 2018-10-02 DIAGNOSIS — Z13220 Encounter for screening for lipoid disorders: Secondary | ICD-10-CM | POA: Diagnosis not present

## 2018-10-02 DIAGNOSIS — Z23 Encounter for immunization: Secondary | ICD-10-CM | POA: Diagnosis not present

## 2018-10-02 NOTE — Patient Instructions (Signed)
[facility] - call your insurance company and see where this is covered

## 2018-10-02 NOTE — Progress Notes (Signed)
Patient: Michelle Shea Female    DOB: Aug 05, 1965   53 y.o.   MRN: [id number] Visit Date: 10/03/2018  Today's Provider: Trinna Post, PA-C   Chief Complaint  Patient presents with  . Hypertension   Subjective:     HPI  Hypertension, follow-up:  BP Readings from Last 3 Encounters:  10/02/18 110/70  05/03/17 118/88  01/15/17 (!) 134/100    She was last seen for hypertension 1 years ago by Carmon Ginsberg, PA-C.  BP at that visit was 118/ 88. Management since that visit includes no changes. She is currently on amlodipine 5 mg QD and chlorthalidone 25 mg QD. She reports fair compliance with treatment. She has been out of Amlodipine for 2 days. She reports muscle cramping in her legs on occasion.  She is exercising. She is not adherent to low salt diet.   Outside blood pressures are not being checked. She is experiencing none.  Patient denies chest pain, chest pressure/discomfort, claudication, dyspnea, exertional chest pressure/discomfort, fatigue, irregular heart beat, lower extremity edema, near-syncope, orthopnea, palpitations, paroxysmal nocturnal dyspnea, syncope and tachypnea.   Cardiovascular risk factors include hypertension.  Use of agents associated with hypertension: none.     Weight trend: stable Wt Readings from Last 3 Encounters:  10/02/18 210 lb (95.3 kg)  05/03/17 213 lb (96.6 kg)  01/15/17 216 lb (98 kg)    Current diet: well balanced  Mammogram: Last year at [facility], available in [place]. She is followed at [facility] for history of ovarian cancer. She has had a complete hysterectomy and bilateral salpingoophrectomy.   Colon Cancer Screening: Has had colonoscopy many years ago but not since she has turned 36. Agreeable to referral today.   Tetanus: Due. ------------------------------------------------------------------------  No Known Allergies   Current Outpatient Medications:  .  amLODipine (NORVASC) 5 MG tablet, Take 1 tablet (5 mg  total) by mouth daily., Disp: 30 tablet, Rfl: 0 .  chlorthalidone (HYGROTON) 25 MG tablet, Take 1 tablet (25 mg total) by mouth daily., Disp: 90 tablet, Rfl: 3 .  ibuprofen (ADVIL,MOTRIN) 800 MG tablet, Take 1 tablet (800 mg total) by mouth every 8 (eight) hours as needed for moderate pain., Disp: 15 tablet, Rfl: 0 .  aspirin 81 MG chewable tablet, Chew by mouth., Disp: , Rfl:   Review of Systems  Constitutional: Negative for appetite change, chills, fatigue and fever.  Respiratory: Negative for chest tightness and shortness of breath.   Cardiovascular: Negative for chest pain and palpitations.  Gastrointestinal: Negative for abdominal pain, nausea and vomiting.  Neurological: Negative for dizziness and weakness.    Social History   Tobacco Use  . Smoking status: Never Smoker  . Smokeless tobacco: Never Used  Substance Use Topics  . Alcohol use: Yes    Alcohol/week: 0.0 standard drinks    Comment: sometimes      Objective:   BP 110/70 (BP Location: Left Arm, Patient Position: Sitting, Cuff Size: Large)   Pulse 67   Temp 98.5 F (36.9 C) (Oral)   Resp 16   Wt 210 lb (95.3 kg)   SpO2 97% Comment: room air  BMI 34.95 kg/m  Vitals:   10/02/18 1506  BP: 110/70  Pulse: 67  Resp: 16  Temp: 98.5 F (36.9 C)  TempSrc: Oral  SpO2: 97%  Weight: 210 lb (95.3 kg)     Physical Exam Constitutional:      Appearance: Normal appearance.  Cardiovascular:     Rate and Rhythm:  Normal rate and regular rhythm.     Heart sounds: Normal heart sounds.  Pulmonary:     Effort: Pulmonary effort is normal.     Breath sounds: Normal breath sounds.  Abdominal:     General: Abdomen is flat. Bowel sounds are normal.     Palpations: Abdomen is soft.  Skin:    General: Skin is warm and dry.  Neurological:     Mental Status: She is alert.  Psychiatric:        Mood and Affect: Mood normal.        Behavior: Behavior normal.      Results for orders placed or performed in visit on  10/02/18  CBC with Differential/Platelet  Result Value Ref Range   WBC 7.9 3.4 - 10.8 x10E3/uL   RBC 4.28 3.77 - 5.28 x10E6/uL   Hemoglobin 12.3 11.1 - 15.9 g/dL   Hematocrit 36.4 34.0 - 46.6 %   MCV 85 79 - 97 fL   MCH 28.7 26.6 - 33.0 pg   MCHC 33.8 31.5 - 35.7 g/dL   RDW 13.6 11.7 - 15.4 %   Platelets 271 150 - 450 x10E3/uL   Neutrophils 58 Not Estab. %   Lymphs 32 Not Estab. %   Monocytes 7 Not Estab. %   Eos 2 Not Estab. %   Basos 1 Not Estab. %   Neutrophils Absolute 4.5 1.4 - 7.0 x10E3/uL   Lymphocytes Absolute 2.5 0.7 - 3.1 x10E3/uL   Monocytes Absolute 0.6 0.1 - 0.9 x10E3/uL   EOS (ABSOLUTE) 0.2 0.0 - 0.4 x10E3/uL   Basophils Absolute 0.1 0.0 - 0.2 x10E3/uL   Immature Granulocytes 0 Not Estab. %   Immature Grans (Abs) 0.0 0.0 - 0.1 x10E3/uL  Comprehensive metabolic panel  Result Value Ref Range   Glucose 89 65 - 99 mg/dL   BUN 13 6 - 24 mg/dL   Creatinine, Ser 0.96 0.57 - 1.00 mg/dL   GFR calc non Af Amer 68 >59 mL/min/1.73   GFR calc Af Amer 78 >59 mL/min/1.73   BUN/Creatinine Ratio 14 9 - 23   Sodium 142 134 - 144 mmol/L   Potassium 2.9 (L) 3.5 - 5.2 mmol/L   Chloride 99 96 - 106 mmol/L   CO2 26 20 - 29 mmol/L   Calcium 9.6 8.7 - 10.2 mg/dL   Total Protein 7.1 6.0 - 8.5 g/dL   Albumin 4.2 3.8 - 4.9 g/dL   Globulin, Total 2.9 1.5 - 4.5 g/dL   Albumin/Globulin Ratio 1.4 1.2 - 2.2   Bilirubin Total 0.4 0.0 - 1.2 mg/dL   Alkaline Phosphatase 129 (H) 39 - 117 IU/L   AST 16 0 - 40 IU/L   ALT 14 0 - 32 IU/L  Lipid panel  Result Value Ref Range   Cholesterol, Total 194 100 - 199 mg/dL   Triglycerides 116 0 - 149 mg/dL   HDL 46 >39 mg/dL   VLDL Cholesterol Cal 23 5 - 40 mg/dL   LDL Calculated 125 (H) 0 - 99 mg/dL   Chol/HDL Ratio 4.2 0.0 - 4.4 ratio  HIV Antibody (routine testing w rflx)  Result Value Ref Range   HIV Screen 4th Generation wRfx Non Reactive Non Reactive       Assessment & Plan    1. Annual physical exam  Tetanus updated today, mammogram  within past year. Colon cancer screening due and has been ordered.  2. Essential hypertension  Patient reporting muscle cramps and potassium is 2.9. Likely due to  her chlorthalidone, she does not consistently come into clinic and I feel it would be best to stop this and increase amlodipine if needed. Follow up with CMET in 1 week to recheck potassium and OV in 1 month to recheck BP.   - Comprehensive Metabolic Panel ([facility]) - CBC with Differential  3. Colon cancer screening  - Ambulatory referral to Gastroenterology  4. Lipid screening  - Lipid Profile  5. Encounter for screening for HIV  - HIV antibody (with reflex)  6. Need for Tdap vaccination  - Tdap vaccine greater than or equal to 7yo IM  7. Malignant neoplasm of peritoneum ([place])  Followed by [facility].  The entirety of the information documented in the History of Present Illness, Review of Systems and Physical Exam were personally obtained by me. Portions of this information were initially documented by Meyer Cory, CMA and reviewed by me for thoroughness and accuracy.        Trinna Post, PA-C  [facility]

## 2018-10-03 ENCOUNTER — Telehealth: Payer: Self-pay | Admitting: *Deleted

## 2018-10-03 DIAGNOSIS — E876 Hypokalemia: Secondary | ICD-10-CM

## 2018-10-03 DIAGNOSIS — I10 Essential (primary) hypertension: Secondary | ICD-10-CM

## 2018-10-03 LAB — CBC WITH DIFFERENTIAL/PLATELET
Basophils Absolute: 0.1 10*3/uL (ref 0.0–0.2)
Basos: 1 %
EOS (ABSOLUTE): 0.2 10*3/uL (ref 0.0–0.4)
Eos: 2 %
Hematocrit: 36.4 % (ref 34.0–46.6)
Hemoglobin: 12.3 g/dL (ref 11.1–15.9)
Immature Grans (Abs): 0 10*3/uL (ref 0.0–0.1)
Immature Granulocytes: 0 %
Lymphocytes Absolute: 2.5 10*3/uL (ref 0.7–3.1)
Lymphs: 32 %
MCH: 28.7 pg (ref 26.6–33.0)
MCHC: 33.8 g/dL (ref 31.5–35.7)
MCV: 85 fL (ref 79–97)
Monocytes Absolute: 0.6 10*3/uL (ref 0.1–0.9)
Monocytes: 7 %
Neutrophils Absolute: 4.5 10*3/uL (ref 1.4–7.0)
Neutrophils: 58 %
Platelets: 271 10*3/uL (ref 150–450)
RBC: 4.28 x10E6/uL (ref 3.77–5.28)
RDW: 13.6 % (ref 11.7–15.4)
WBC: 7.9 10*3/uL (ref 3.4–10.8)

## 2018-10-03 LAB — HIV ANTIBODY (ROUTINE TESTING W REFLEX): HIV Screen 4th Generation wRfx: NONREACTIVE

## 2018-10-03 LAB — COMPREHENSIVE METABOLIC PANEL
ALT: 14 IU/L (ref 0–32)
AST: 16 IU/L (ref 0–40)
Albumin/Globulin Ratio: 1.4 (ref 1.2–2.2)
Albumin: 4.2 g/dL (ref 3.8–4.9)
Alkaline Phosphatase: 129 IU/L — ABNORMAL HIGH (ref 39–117)
BUN/Creatinine Ratio: 14 (ref 9–23)
BUN: 13 mg/dL (ref 6–24)
Bilirubin Total: 0.4 mg/dL (ref 0.0–1.2)
CO2: 26 mmol/L (ref 20–29)
Calcium: 9.6 mg/dL (ref 8.7–10.2)
Chloride: 99 mmol/L (ref 96–106)
Creatinine, Ser: 0.96 mg/dL (ref 0.57–1.00)
GFR calc Af Amer: 78 mL/min/{1.73_m2} (ref >59)
GFR calc non Af Amer: 68 mL/min/{1.73_m2} (ref >59)
Globulin, Total: 2.9 g/dL (ref 1.5–4.5)
Glucose: 89 mg/dL (ref 65–99)
Potassium: 2.9 mmol/L — ABNORMAL LOW (ref 3.5–5.2)
Sodium: 142 mmol/L (ref 134–144)
Total Protein: 7.1 g/dL (ref 6.0–8.5)

## 2018-10-03 LAB — LIPID PANEL
Chol/HDL Ratio: 4.2 ratio (ref 0.0–4.4)
Cholesterol, Total: 194 mg/dL (ref 100–199)
HDL: 46 mg/dL (ref >39)
LDL Calculated: 125 mg/dL — ABNORMAL HIGH (ref 0–99)
Triglycerides: 116 mg/dL (ref 0–149)
VLDL Cholesterol Cal: 23 mg/dL (ref 5–40)

## 2018-10-03 MED ORDER — AMLODIPINE BESYLATE 5 MG PO TABS: 5.0000 mg | ORAL_TABLET | Freq: Every day | ORAL | 1 refills | Status: DC

## 2018-10-03 NOTE — Telephone Encounter (Signed)
Patient was notified of results. Expressed understanding. CMET ordered.

## 2018-10-03 NOTE — Telephone Encounter (Signed)
-----   Message from Trinna Post, [place] sent at 10/03/2018  8:43 AM EDT ----- Her labs look OK except her potassium is low, which would explain her muscle cramps. This is due to her chlorthalidone. Since she really doesn't come into the office much and it would be hard to monitor this, I recommend she stop this medication and we can increase the amlodipine to 10 mg daily if her blood pressure goes high. I would like her to stop the chlorthalidone and repeat the CMET in a week, please place order. I would like her to follow up with OV in one month to check BP. Also, please abstract mammo from are everywhere, thanks.

## 2018-11-04 ENCOUNTER — Encounter: Payer: Self-pay | Admitting: Physician Assistant

## 2018-11-04 ENCOUNTER — Ambulatory Visit: Payer: BC Managed Care – PPO | Admitting: Physician Assistant

## 2018-11-04 ENCOUNTER — Other Ambulatory Visit: Payer: Self-pay

## 2018-11-04 VITALS — BP 124/90 | HR 81 | Temp 97.3°F | Resp 16 | Wt 212.0 lb

## 2018-11-04 DIAGNOSIS — E876 Hypokalemia: Secondary | ICD-10-CM

## 2018-11-04 DIAGNOSIS — I10 Essential (primary) hypertension: Secondary | ICD-10-CM | POA: Diagnosis not present

## 2018-11-04 MED ORDER — AMLODIPINE BESYLATE 10 MG PO TABS
10.0000 mg | ORAL_TABLET | Freq: Every day | ORAL | 0 refills | Status: DC
Start: 2018-11-04 — End: 2019-07-06

## 2018-11-04 NOTE — Patient Instructions (Signed)

## 2018-11-04 NOTE — Progress Notes (Signed)
Patient: Michelle Shea Female    DOB: 1966/01/01   53 y.o.   MRN: [id number] Visit Date: 11/04/2018  Today's Provider: Trinna Post, PA-C   Chief Complaint  Patient presents with  . Follow-up  . Hypertension   Subjective:     HPI   Essential hypertension From 10/02/2018-stopped chlorthalidone and increased amlodipine to 10 mg qd. Follow up with [facility] in 1 week to recheck potassium and OV in 1 month to recheck BP. Patient did not get her labs rechecked. Previously had leg cramps which have since resolved.    No Known Allergies   Current Outpatient Medications:  .  amLODipine (NORVASC) 5 MG tablet, Take 1 tablet (5 mg total) by mouth daily., Disp: 90 tablet, Rfl: 1 .  aspirin 81 MG chewable tablet, Chew by mouth., Disp: , Rfl:  .  chlorthalidone (HYGROTON) 25 MG tablet, Take 1 tablet (25 mg total) by mouth daily., Disp: 90 tablet, Rfl: 3 .  ibuprofen (ADVIL,MOTRIN) 800 MG tablet, Take 1 tablet (800 mg total) by mouth every 8 (eight) hours as needed for moderate pain., Disp: 15 tablet, Rfl: 0  Review of Systems  Constitutional: Negative for appetite change, chills, fatigue and fever.  Respiratory: Negative for chest tightness and shortness of breath.   Cardiovascular: Negative for chest pain and palpitations.  Gastrointestinal: Negative for abdominal pain, nausea and vomiting.  Neurological: Negative for dizziness and weakness.    Social History   Tobacco Use  . Smoking status: Never Smoker  . Smokeless tobacco: Never Used  Substance Use Topics  . Alcohol use: Yes    Alcohol/week: 0.0 standard drinks    Comment: sometimes      Objective:   BP 124/90 (BP Location: Right Arm, Patient Position: Sitting, Cuff Size: Large)   Pulse 81   Temp (!) 97.3 F (36.3 C) (Temporal)   Resp 16   Wt 212 lb (96.2 kg)   SpO2 96%   BMI 35.28 kg/m  Vitals:   11/04/18 0958  BP: 124/90  Pulse: 81  Resp: 16  Temp: (!) 97.3 F (36.3 C)  TempSrc: Temporal  SpO2: 96%    Weight: 212 lb (96.2 kg)     Physical Exam Constitutional:      Appearance: Normal appearance.  Cardiovascular:     Rate and Rhythm: Normal rate and regular rhythm.     Heart sounds: Normal heart sounds.  Pulmonary:     Effort: Pulmonary effort is normal.     Breath sounds: Normal breath sounds.  Skin:    General: Skin is warm and dry.  Neurological:     Mental Status: She is alert and oriented to person, place, and time. Mental status is at baseline.  Psychiatric:        Mood and Affect: Mood normal.        Behavior: Behavior normal.      Results for orders placed or performed in visit on 11/04/18  HM MAMMOGRAPHY  Result Value Ref Range   HM Mammogram 0-4 Bi-Rad 0-4 Bi-Rad, Self Reported Normal  HM MAMMOGRAPHY  Result Value Ref Range   HM Mammogram 0-4 Bi-Rad 0-4 Bi-Rad, Self Reported Normal       Assessment & Plan    1. Essential hypertension  Still hold off on chlorthalidone. Increase amlodipine 10 mg daily.   - amLODipine (NORVASC) 10 MG tablet; Take 1 tablet (10 mg total) by mouth daily.  Dispense: 90 tablet; Refill: 0 - Comprehensive Metabolic  Panel (CMET) - Magnesium  2. Hypokalemia  - Comprehensive Metabolic Panel ([facility]) - Magnesium  The entirety of the information documented in the History of Present Illness, Review of Systems and Physical Exam were personally obtained by me. Portions of this information were initially documented by April M. Sabra Heck, CMA and reviewed by me for thoroughness and accuracy.   F/u 1 year CPE      Trinna Post, PA-C  [facility]

## 2018-11-05 ENCOUNTER — Other Ambulatory Visit: Payer: Self-pay

## 2018-11-05 DIAGNOSIS — Z20822 Contact with and (suspected) exposure to covid-19: Secondary | ICD-10-CM

## 2018-11-05 DIAGNOSIS — R6889 Other general symptoms and signs: Secondary | ICD-10-CM | POA: Diagnosis not present

## 2018-11-05 LAB — COMPREHENSIVE METABOLIC PANEL
ALT: 11 IU/L (ref 0–32)
AST: 15 IU/L (ref 0–40)
Albumin/Globulin Ratio: 1.5 (ref 1.2–2.2)
Albumin: 4 g/dL (ref 3.8–4.9)
Alkaline Phosphatase: 137 IU/L — ABNORMAL HIGH (ref 39–117)
BUN/Creatinine Ratio: 10 (ref 9–23)
BUN: 8 mg/dL (ref 6–24)
Bilirubin Total: 0.3 mg/dL (ref 0.0–1.2)
CO2: 22 mmol/L (ref 20–29)
Calcium: 9.3 mg/dL (ref 8.7–10.2)
Chloride: 104 mmol/L (ref 96–106)
Creatinine, Ser: 0.78 mg/dL (ref 0.57–1.00)
GFR calc Af Amer: 100 mL/min/{1.73_m2} (ref >59)
GFR calc non Af Amer: 87 mL/min/{1.73_m2} (ref >59)
Globulin, Total: 2.7 g/dL (ref 1.5–4.5)
Glucose: 97 mg/dL (ref 65–99)
Potassium: 3.8 mmol/L (ref 3.5–5.2)
Sodium: 142 mmol/L (ref 134–144)
Total Protein: 6.7 g/dL (ref 6.0–8.5)

## 2018-11-05 LAB — MAGNESIUM: Magnesium: 1.6 mg/dL (ref 1.6–2.3)

## 2018-11-06 LAB — NOVEL CORONAVIRUS, NAA: SARS-CoV-2, NAA: NOT DETECTED

## 2018-11-10 ENCOUNTER — Encounter: Payer: Self-pay | Admitting: *Deleted

## 2019-02-19 ENCOUNTER — Other Ambulatory Visit: Payer: Self-pay

## 2019-02-19 DIAGNOSIS — Z20822 Contact with and (suspected) exposure to covid-19: Secondary | ICD-10-CM

## 2019-02-20 LAB — NOVEL CORONAVIRUS, NAA: SARS-CoV-2, NAA: NOT DETECTED

## 2019-03-09 ENCOUNTER — Other Ambulatory Visit: Payer: Self-pay | Admitting: Physician Assistant

## 2019-03-09 DIAGNOSIS — Z1211 Encounter for screening for malignant neoplasm of colon: Secondary | ICD-10-CM

## 2019-03-09 NOTE — Telephone Encounter (Signed)
[place] faxed refill request for the following medications:  chlorthalidone (HYGROTON) 25 MG tablet [id number]   Please advise.  Thanks, [employer]

## 2019-03-10 NOTE — Telephone Encounter (Signed)
Please advise refill? Patient was advised to hold off on taking chorthalidone 11/04/2018.

## 2019-03-10 NOTE — Addendum Note (Signed)
Addended by: Trinna Post on: 03/10/2019 11:41 AM   Modules accepted: Orders

## 2019-03-10 NOTE — Telephone Encounter (Signed)
Contacted patient and she states that she has called [facility] and left a message and no one called her back. 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

## 2019-03-10 NOTE — Telephone Encounter (Signed)
Yes agreed, she does not need to be taking this. Also GI tried to contact her about colonoscopy and was unsuccessful. If she doesn't want colonoscopy I will send her a cologuard to complete for colon cancer screening.

## 2019-03-10 NOTE — Telephone Encounter (Signed)
OK I placed new referral. They did send a letter home. I would recommend making sure her voicemail is clear because they will leave a message and then she can call them when she is free.

## 2019-03-16 ENCOUNTER — Telehealth: Payer: Self-pay

## 2019-03-16 ENCOUNTER — Other Ambulatory Visit: Payer: Self-pay

## 2019-03-16 DIAGNOSIS — Z1211 Encounter for screening for malignant neoplasm of colon: Secondary | ICD-10-CM

## 2019-03-16 NOTE — Telephone Encounter (Signed)
Gastroenterology Pre-Procedure Review  Request Date: Friday 04/03/19 Requesting Physician: Dr. Marius Ditch  PATIENT REVIEW QUESTIONS: The patient responded to the following health history questions as indicated:    1. Are you having any GI issues? no 2. Do you have a personal history of Polyps? no 3. Do you have a family history of Colon Cancer or Polyps? no 4. Diabetes Mellitus? no 5. Joint replacements in the past 12 months?no 6. Major health problems in the past 3 months?no 7. Any artificial heart valves, MVP, or defibrillator?no    MEDICATIONS & ALLERGIES:    Patient reports the following regarding taking any anticoagulation/antiplatelet therapy:   Plavix, Coumadin, Eliquis, Xarelto, Lovenox, Pradaxa, Brilinta, or Effient? no Aspirin? no Patient no longer takes 81mg  aspirin  Patient confirms/reports the following medications:  Current Outpatient Medications  Medication Sig Dispense Refill  . amLODipine (NORVASC) 10 MG tablet Take 1 tablet (10 mg total) by mouth daily. 90 tablet 0  . aspirin 81 MG chewable tablet Chew by mouth.    Marland Kitchen ibuprofen (ADVIL,MOTRIN) 800 MG tablet Take 1 tablet (800 mg total) by mouth every 8 (eight) hours as needed for moderate pain. 15 tablet 0   No current facility-administered medications for this visit.    Patient confirms/reports the following allergies:  No Known Allergies  No orders of the defined types were placed in this encounter.   AUTHORIZATION INFORMATION Primary Insurance: 1D#: Group #:  Secondary Insurance: 1D#: Group #:  SCHEDULE INFORMATION: Date: 04/03/19 Time: Location:[facility]

## 2019-04-01 ENCOUNTER — Other Ambulatory Visit
Admission: RE | Admit: 2019-04-01 | Discharge: 2019-04-01 | Disposition: A | Discharge status: Home / Self Care | Payer: BC Managed Care – PPO | Source: Ambulatory Visit | Attending: Gastroenterology | Admitting: Gastroenterology

## 2019-04-01 ENCOUNTER — Other Ambulatory Visit: Payer: Self-pay

## 2019-04-01 DIAGNOSIS — Z01812 Encounter for preprocedural laboratory examination: Secondary | ICD-10-CM | POA: Diagnosis not present

## 2019-04-01 DIAGNOSIS — Z20822 Contact with and (suspected) exposure to covid-19: Secondary | ICD-10-CM | POA: Diagnosis not present

## 2019-04-01 LAB — SARS CORONAVIRUS 2 (TAT 6-24 HRS): SARS Coronavirus 2: NEGATIVE

## 2019-04-02 ENCOUNTER — Telehealth: Payer: Self-pay | Admitting: Gastroenterology

## 2019-04-02 ENCOUNTER — Other Ambulatory Visit: Payer: Self-pay

## 2019-04-02 DIAGNOSIS — Z1211 Encounter for screening for malignant neoplasm of colon: Secondary | ICD-10-CM

## 2019-04-02 MED ORDER — NA SULFATE-K SULFATE-MG SULF 17.5-3.13-1.6 GM/177ML PO SOLN: 1.0000 | Freq: Once | ORAL | 0 refills | Status: AC

## 2019-04-02 NOTE — Telephone Encounter (Signed)
Notified patient her rx for Suprep bowel kit has been sent to [place].  Thanks [employer]

## 2019-04-02 NOTE — Telephone Encounter (Signed)
Pt needs suprep for procedure tomorrow send to [facility] [address]

## 2019-04-03 ENCOUNTER — Encounter: Admission: RE | Payer: Self-pay | Source: Home / Self Care

## 2019-04-03 ENCOUNTER — Ambulatory Visit
Admission: RE | Admit: 2019-04-03 | Payer: BC Managed Care – PPO | Source: Home / Self Care | Admitting: Gastroenterology

## 2019-04-03 SURGERY — COLONOSCOPY WITH PROPOFOL
Anesthesia: General

## 2019-04-03 MED ORDER — PROPOFOL 500 MG/50ML IV EMUL
INTRAVENOUS | Status: AC
  Filled 2019-04-03: qty 50

## 2019-04-13 DIAGNOSIS — Z1231 Encounter for screening mammogram for malignant neoplasm of breast: Secondary | ICD-10-CM | POA: Diagnosis not present

## 2019-04-13 LAB — HM MAMMOGRAPHY

## 2019-04-30 DIAGNOSIS — Z1211 Encounter for screening for malignant neoplasm of colon: Secondary | ICD-10-CM | POA: Diagnosis not present

## 2019-04-30 DIAGNOSIS — C482 Malignant neoplasm of peritoneum, unspecified: Secondary | ICD-10-CM | POA: Diagnosis not present

## 2019-04-30 DIAGNOSIS — K439 Ventral hernia without obstruction or gangrene: Secondary | ICD-10-CM | POA: Diagnosis not present

## 2019-06-12 DIAGNOSIS — C482 Malignant neoplasm of peritoneum, unspecified: Secondary | ICD-10-CM | POA: Diagnosis not present

## 2019-07-06 ENCOUNTER — Other Ambulatory Visit: Payer: Self-pay | Admitting: Physician Assistant

## 2019-07-06 DIAGNOSIS — I10 Essential (primary) hypertension: Secondary | ICD-10-CM

## 2019-12-11 ENCOUNTER — Encounter: Payer: Self-pay | Admitting: Physician Assistant

## 2019-12-11 ENCOUNTER — Ambulatory Visit: Payer: Self-pay

## 2019-12-11 ENCOUNTER — Other Ambulatory Visit: Payer: Self-pay

## 2019-12-11 ENCOUNTER — Ambulatory Visit: Payer: BC Managed Care – PPO | Admitting: Physician Assistant

## 2019-12-11 VITALS — BP 129/88 | HR 75 | Temp 97.4°F | Resp 16 | Ht 63.0 in | Wt 217.2 lb

## 2019-12-11 DIAGNOSIS — I10 Essential (primary) hypertension: Secondary | ICD-10-CM

## 2019-12-11 NOTE — Telephone Encounter (Signed)
Agent tried to transfer pt. For triage call. Pt. Disconnected. Called work and [employer]. No answer. Pt. Had reported she was at work.

## 2019-12-11 NOTE — Progress Notes (Signed)
Established patient visit   Patient: Michelle Shea   DOB: June 28, 1965   54 y.o. Female  MRN: [id number] Visit Date: 12/11/2019  Today's healthcare provider: Mar Daring, PA-C   Chief Complaint  Patient presents with  . Hypertension   Subjective    HPI  Patient here with c/o elevated blood pressure. Reports that at work they were checking BP's and her BP was 140/90 and 140/100 She was seen for Essential Hypertension 11/04/18 and her Amlodipine was increased to 10mg  from 5mg . Patient reports that she went back to 5mg  because with the 10mg  she was cramping a lot. Has previously been on HCTZ but this also caused cramping and hypokalemia.   BP Readings from Last 3 Encounters:  12/11/19 129/88  11/04/18 124/90  10/02/18 110/70   Patient had her flu shot today at work while having the biometric screening.   Patient Active Problem List   Diagnosis Date Noted  . History of anemia 11/03/2015  . Ovarian cancer ([place]) 12/03/2014  . Cancer of abdominal wall 07/07/2012  . Malignant neoplasm of peritoneum ([place]) 07/07/2012  . Other specified postprocedural states 05/28/2012  . BP (high blood pressure) 05/19/2012  . Endometriosis in cutaneous scar 11/08/2011   Past Medical History:  Diagnosis Date  . Anemia   . Endometriosis   . Hypertension   . Ovarian cancer ([place])        Medications: Outpatient Medications Prior to Visit  Medication Sig  . amLODipine (NORVASC) 10 MG tablet Take 1 tablet by mouth once daily (Patient taking differently: No sig reported)  . aspirin 81 MG chewable tablet Chew by mouth.  Marland Kitchen ibuprofen (ADVIL,MOTRIN) 800 MG tablet Take 1 tablet (800 mg total) by mouth every 8 (eight) hours as needed for moderate pain.   No facility-administered medications prior to visit.    Review of Systems  Constitutional: Negative.  Negative for fatigue.  Respiratory: Negative.   Cardiovascular: Negative.   Neurological: Negative.     Last CBC Lab Results    Component Value Date   WBC 7.9 10/02/2018   HGB 12.3 10/02/2018   HCT 36.4 10/02/2018   MCV 85 10/02/2018   MCH 28.7 10/02/2018   RDW 13.6 10/02/2018   PLT 271 89/38/1017   Last metabolic panel Lab Results  Component Value Date   GLUCOSE 97 11/04/2018   NA 142 11/04/2018   K 3.8 11/04/2018   CL 104 11/04/2018   CO2 22 11/04/2018   BUN 8 11/04/2018   CREATININE 0.78 11/04/2018   GFRNONAA 87 11/04/2018   GFRAA 100 11/04/2018   CALCIUM 9.3 11/04/2018   PHOS 4.5 12/03/2014   PROT 6.7 11/04/2018   ALBUMIN 4.0 11/04/2018   LABGLOB 2.7 11/04/2018   AGRATIO 1.5 11/04/2018   BILITOT 0.3 11/04/2018   ALKPHOS 137 (H) 11/04/2018   AST 15 11/04/2018   ALT 11 11/04/2018      Objective    BP 129/88 (BP Location: Left Arm, Patient Position: Sitting, Cuff Size: Large)   Pulse 75   Temp (!) 97.4 F (36.3 C) (Oral)   Resp 16   Ht 5\' 3"  (1.6 m)   Wt 217 lb 3.2 oz (98.5 kg)   SpO2 100%   BMI 38.48 kg/m  BP Readings from Last 3 Encounters:  12/11/19 129/88  11/04/18 124/90  10/02/18 110/70   Wt Readings from Last 3 Encounters:  12/11/19 217 lb 3.2 oz (98.5 kg)  11/04/18 212 lb (96.2 kg)  10/02/18 210 lb (  95.3 kg)      Physical Exam Vitals reviewed.  Constitutional:      General: She is not in acute distress.    Appearance: Normal appearance. She is well-developed. She is obese. She is not ill-appearing or diaphoretic.  Cardiovascular:     Rate and Rhythm: Normal rate and regular rhythm.     Pulses: Normal pulses.     Heart sounds: Normal heart sounds. No murmur heard.  No friction rub. No gallop.   Pulmonary:     Effort: Pulmonary effort is normal. No respiratory distress.     Breath sounds: Normal breath sounds. No wheezing or rales.  Musculoskeletal:     Cervical back: Normal range of motion and neck supple.     Right lower leg: No edema.     Left lower leg: No edema.  Neurological:     Mental Status: She is alert.       No results found for any visits on  12/11/19.  Assessment & Plan     1. Essential hypertension Today's reading is normal. Suspect anxiety with biometric screening causing elevated reading earlier. Discussed monitoring BP at home. Continue amlodipine 5mg . Call if BP > 140/90 at home.   Return if symptoms worsen or fail to improve.      Reynolds Bowl, PA-C, have reviewed all documentation for this visit. The documentation on 12/15/19 for the exam, diagnosis, procedures, and orders are all accurate and complete.   Rubye Beach  [facility] [phone] (phone) [phone] (fax)  [place]

## 2019-12-11 NOTE — Telephone Encounter (Signed)
  Reason for Disposition . Systolic BP  >= 300 OR Diastolic >= 923  Answer Assessment - Initial Assessment Questions 1. BLOOD PRESSURE: "What is the blood pressure?" "Did you take at least two measurements 5 minutes apart?"     140/90  And 140/100 2. ONSET: "When did you take your blood pressure?"     This morning at work 3. HOW: "How did you obtain the blood pressure?" (e.g., visiting nurse, automatic home BP monitor)     Nurse - automatic cuff 4. HISTORY: "Do you have a history of high blood pressure?"     Yes 5. MEDICATIONS: "Are you taking any medications for blood pressure?" "Have you missed any doses recently?"     No missed doses 6. OTHER SYMPTOMS: "Do you have any symptoms?" (e.g., headache, chest pain, blurred vision, difficulty breathing, weakness)     Mild headache 7. PREGNANCY: "Is there any chance you are pregnant?" "When was your last menstrual period?"     No  Protocols used: BLOOD PRESSURE - HIGH-A-AH

## 2019-12-11 NOTE — Patient Instructions (Signed)
DASH Eating Plan DASH stands for "Dietary Approaches to Stop Hypertension." The DASH eating plan is a healthy eating plan that has been shown to reduce high blood pressure (hypertension). It may also reduce your risk for type 2 diabetes, heart disease, and stroke. The DASH eating plan may also help with weight loss. What are tips for following this plan?  General guidelines  Avoid eating more than 2,300 mg (milligrams) of salt (sodium) a day. If you have hypertension, you may need to reduce your sodium intake to 1,500 mg a day.  Limit alcohol intake to no more than 1 drink a day for nonpregnant women and 2 drinks a day for men. One drink equals 12 oz of beer, 5 oz of wine, or 1 oz of hard liquor.  Work with your health care provider to maintain a healthy body weight or to lose weight. Ask what an ideal weight is for you.  Get at least 30 minutes of exercise that causes your heart to beat faster (aerobic exercise) most days of the week. Activities may include walking, swimming, or biking.  Work with your health care provider or diet and nutrition specialist (dietitian) to adjust your eating plan to your individual calorie needs. Reading food labels   Check food labels for the amount of sodium per serving. Choose foods with less than 5 percent of the Daily Value of sodium. Generally, foods with less than 300 mg of sodium per serving fit into this eating plan.  To find whole grains, look for the word "whole" as the first word in the ingredient list. Shopping  Buy products labeled as "low-sodium" or "no salt added."  Buy fresh foods. Avoid canned foods and premade or frozen meals. Cooking  Avoid adding salt when cooking. Use salt-free seasonings or herbs instead of table salt or sea salt. Check with your health care provider or pharmacist before using salt substitutes.  Do not fry foods. Cook foods using healthy methods such as baking, boiling, grilling, and broiling instead.  Cook with  heart-healthy oils, such as olive, canola, soybean, or sunflower oil. Meal planning  Eat a balanced diet that includes: ? 5 or more servings of fruits and vegetables each day. At each meal, try to fill half of your plate with fruits and vegetables. ? Up to 6-8 servings of whole grains each day. ? Less than 6 oz of lean meat, poultry, or fish each day. A 3-oz serving of meat is about the same size as a deck of cards. One egg equals 1 oz. ? 2 servings of low-fat dairy each day. ? A serving of nuts, seeds, or beans 5 times each week. ? Heart-healthy fats. Healthy fats called Omega-3 fatty acids are found in foods such as flaxseeds and coldwater fish, like sardines, salmon, and mackerel.  Limit how much you eat of the following: ? Canned or prepackaged foods. ? Food that is high in trans fat, such as fried foods. ? Food that is high in saturated fat, such as fatty meat. ? Sweets, desserts, sugary drinks, and other foods with added sugar. ? Full-fat dairy products.  Do not salt foods before eating.  Try to eat at least 2 vegetarian meals each week.  Eat more home-cooked food and less restaurant, buffet, and fast food.  When eating at a restaurant, ask that your food be prepared with less salt or no salt, if possible. What foods are recommended? The items listed may not be a complete list. Talk with your dietitian about   what dietary choices are best for you. Grains Whole-grain or whole-wheat bread. Whole-grain or whole-wheat pasta. Brown rice. Oatmeal. Quinoa. Bulgur. Whole-grain and low-sodium cereals. Pita bread. Low-fat, low-sodium crackers. Whole-wheat flour tortillas. Vegetables Fresh or frozen vegetables (raw, steamed, roasted, or grilled). Low-sodium or reduced-sodium tomato and vegetable juice. Low-sodium or reduced-sodium tomato sauce and tomato paste. Low-sodium or reduced-sodium canned vegetables. Fruits All fresh, dried, or frozen fruit. Canned fruit in natural juice (without  added sugar). Meat and other protein foods Skinless chicken or turkey. Ground chicken or turkey. Pork with fat trimmed off. Fish and seafood. Egg whites. Dried beans, peas, or lentils. Unsalted nuts, nut butters, and seeds. Unsalted canned beans. Lean cuts of beef with fat trimmed off. Low-sodium, lean deli meat. Dairy Low-fat (1%) or fat-free (skim) milk. Fat-free, low-fat, or reduced-fat cheeses. Nonfat, low-sodium ricotta or cottage cheese. Low-fat or nonfat yogurt. Low-fat, low-sodium cheese. Fats and oils Soft margarine without trans fats. Vegetable oil. Low-fat, reduced-fat, or light mayonnaise and salad dressings (reduced-sodium). Canola, safflower, olive, soybean, and sunflower oils. Avocado. Seasoning and other foods Herbs. Spices. Seasoning mixes without salt. Unsalted popcorn and pretzels. Fat-free sweets. What foods are not recommended? The items listed may not be a complete list. Talk with your dietitian about what dietary choices are best for you. Grains Baked goods made with fat, such as croissants, muffins, or some breads. Dry pasta or rice meal packs. Vegetables Creamed or fried vegetables. Vegetables in a cheese sauce. Regular canned vegetables (not low-sodium or reduced-sodium). Regular canned tomato sauce and paste (not low-sodium or reduced-sodium). Regular tomato and vegetable juice (not low-sodium or reduced-sodium). Pickles. Olives. Fruits Canned fruit in a light or heavy syrup. Fried fruit. Fruit in cream or butter sauce. Meat and other protein foods Fatty cuts of meat. Ribs. Fried meat. Bacon. Sausage. Bologna and other processed lunch meats. Salami. Fatback. Hotdogs. Bratwurst. Salted nuts and seeds. Canned beans with added salt. Canned or smoked fish. Whole eggs or egg yolks. Chicken or turkey with skin. Dairy Whole or 2% milk, cream, and half-and-half. Whole or full-fat cream cheese. Whole-fat or sweetened yogurt. Full-fat cheese. Nondairy creamers. Whipped toppings.  Processed cheese and cheese spreads. Fats and oils Butter. Stick margarine. Lard. Shortening. Ghee. Bacon fat. Tropical oils, such as coconut, palm kernel, or palm oil. Seasoning and other foods Salted popcorn and pretzels. Onion salt, garlic salt, seasoned salt, table salt, and sea salt. Worcestershire sauce. Tartar sauce. Barbecue sauce. Teriyaki sauce. Soy sauce, including reduced-sodium. Steak sauce. Canned and packaged gravies. Fish sauce. Oyster sauce. Cocktail sauce. Horseradish that you find on the shelf. Ketchup. Mustard. Meat flavorings and tenderizers. Bouillon cubes. Hot sauce and Tabasco sauce. Premade or packaged marinades. Premade or packaged taco seasonings. Relishes. Regular salad dressings. Where to find more information:  National Heart, Lung, and Blood Institute: www.nhlbi.nih.gov  American Heart Association: www.heart.org Summary  The DASH eating plan is a healthy eating plan that has been shown to reduce high blood pressure (hypertension). It may also reduce your risk for type 2 diabetes, heart disease, and stroke.  With the DASH eating plan, you should limit salt (sodium) intake to 2,300 mg a day. If you have hypertension, you may need to reduce your sodium intake to 1,500 mg a day.  When on the DASH eating plan, aim to eat more fresh fruits and vegetables, whole grains, lean proteins, low-fat dairy, and heart-healthy fats.  Work with your health care provider or diet and nutrition specialist (dietitian) to adjust your eating plan to your   individual calorie needs. This information is not intended to replace advice given to you by your health care provider. Make sure you discuss any questions you have with your health care provider. Document Revised: 02/15/2017 Document Reviewed: 02/27/2016 Elsevier Patient Education  2020 Elsevier Inc.  

## 2019-12-11 NOTE — Telephone Encounter (Signed)
Pt. Calling from work. Reports they were checking BP's at work and hers was 140/90 and 140/100. She has a mild headache, no other symptoms. Spoke with Elmyra Ricks in the practice. Pt.'s PCP does not have availability today. OK to schedule with Fenton Malling. Appointment made.

## 2019-12-15 MED ORDER — AMLODIPINE BESYLATE 10 MG PO TABS: ORAL_TABLET | ORAL | 0 refills | Status: DC

## 2020-01-12 ENCOUNTER — Other Ambulatory Visit: Payer: Self-pay | Admitting: Physician Assistant

## 2020-01-12 DIAGNOSIS — I10 Essential (primary) hypertension: Secondary | ICD-10-CM

## 2020-01-12 MED ORDER — AMLODIPINE BESYLATE 10 MG PO TABS: ORAL_TABLET | ORAL | 0 refills | Status: DC

## 2020-01-12 NOTE — Telephone Encounter (Signed)
Requested Prescriptions  Pending Prescriptions Disp Refills  . amLODipine (NORVASC) 10 MG tablet 90 tablet 0    Sig: Take 5mg  (0.5 tab) PO daily     Cardiovascular:  Calcium Channel Blockers Passed - 01/12/2020  3:00 PM      Passed - Last BP in normal range    BP Readings from Last 1 Encounters:  12/11/19 129/88         Passed - Valid encounter within last 6 months    Recent Outpatient Visits          1 month ago Essential hypertension   [place], [facility] [place], [place]   1 year ago Essential hypertension   [facility] [place], [place]   1 year ago Annual physical exam   [facility] [place], [place]   2 years ago Essential hypertension   [place], [place], [place]   2 years ago [place], [place]

## 2020-01-12 NOTE — Telephone Encounter (Signed)
Copied from [place] [id number]. Topic: Quick Communication - Rx Refill/Question >> Jan 12, 2020  2:51 PM Mcneil, Ja-Kwan wrote: Medication: amLODipine (NORVASC) 10 MG tablet  Has the patient contacted their pharmacy? no  Preferred Pharmacy (with phone number or street name): [facility] [place] [address] Phone: [phone]  Fax: [phone]  Agent: Please be advised that RX refills may take up to 3 business days. We ask that you follow-up with your pharmacy.

## 2020-06-08 ENCOUNTER — Ambulatory Visit: Payer: Self-pay | Admitting: *Deleted

## 2020-06-08 NOTE — Telephone Encounter (Signed)
Daughter Annamary Carolin calling this am on her way to work concerns for her mom (the pt). She said the pt was sent home from work yesterday due to having severe headache and elevated bp. She said they took her bp at work and it was 148/105.   Callie states she took her mom bp last night and it was consistently around the same thing-148/105.  Callie said she woke her mother this morning before she left to take her bp. Pt still had headache and seemed to be in a "brain fog". Bp was 149/103, and 148/105 again. She told her mother to call the dr and make appt today. But Callie not sure her mother will do that and asking if we could call her mother and make her an appt.   Call to patient- she reports she is under a great amount of stress- her mother is ill and she is busy trying to work. Advised need to get BP lower- call to office for appointment-no appointment available within disposition- advised patient would send message to office- if she cn be worked in within next 3 days- will call her back - otherwise needs to go to UC. Reason for Disposition . Systolic BP  >= 758 OR Diastolic >= 832  Answer Assessment - Initial Assessment Questions 1. BLOOD PRESSURE: "What is the blood pressure?" "Did you take at least two measurements 5 minutes apart?"     148/103,139/104 P 85 2. ONSET: "When did you take your blood pressure?"     7:30, 3. HOW: "How did you obtain the blood pressure?" (e.g., visiting nurse, automatic home BP monitor)     Automatic cuff arm 4. HISTORY: "Do you have a history of high blood pressure?"     yes 5. MEDICATIONS: "Are you taking any medications for blood pressure?" "Have you missed any doses recently?"     Yes- no missed doses 6. OTHER SYMPTOMS: "Do you have any symptoms?" (e.g., headache, chest pain, blurred vision, difficulty breathing, weakness)     No headache- but can get headache-"swimmy feeling" 7. PREGNANCY: "Is there any chance you are pregnant?" "When was your last  menstrual period?"     n/a  Protocols used: BLOOD PRESSURE - HIGH-A-AH

## 2020-06-10 ENCOUNTER — Other Ambulatory Visit: Payer: Self-pay

## 2020-06-10 ENCOUNTER — Ambulatory Visit: Payer: Self-pay

## 2020-06-10 ENCOUNTER — Ambulatory Visit
Admission: RE | Admit: 2020-06-10 | Discharge: 2020-06-10 | Disposition: A | Discharge status: Home / Self Care | Payer: No Typology Code available for payment source | Source: Ambulatory Visit | Attending: Physician Assistant | Admitting: Physician Assistant

## 2020-06-10 VITALS — BP 131/90 | HR 72 | Temp 98.3°F | Resp 14 | Ht 65.0 in | Wt 206.0 lb

## 2020-06-10 DIAGNOSIS — I10 Essential (primary) hypertension: Secondary | ICD-10-CM | POA: Diagnosis not present

## 2020-06-10 NOTE — ED Provider Notes (Signed)
[facility]    CSN: [id number] Arrival date & time: 06/10/20  1350      History   Chief Complaint Chief Complaint  Patient presents with  . Hypertension  . Appointment    HPI Michelle Shea is a 55 y.o. female.   HPI   55 year old female here for evaluation of elevated blood pressure.  Patient reports that she has a history of high blood pressure and has been taking 5 mg of amlodipine but is noticed that her blood pressure has been elevated.  She reports that yesterday while at work she had a fuzzy feeling in her head and had her blood pressure checked and it was 148/105.  Patient denies chest pain, shortness of breath, or dizziness.  No visual disturbances.  Patient states that for the last 2 days she has increased amlodipine to 10 mg daily and reports that her blood pressure has been better.  Currently it is 131/90.  Past Medical History:  Diagnosis Date  . Anemia   . Endometriosis   . Hypertension   . Ovarian cancer [facility])     Patient Active Problem List   Diagnosis Date Noted  . History of anemia 11/03/2015  . Ovarian cancer ([place]) 12/03/2014  . Cancer of abdominal wall 07/07/2012  . 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 Other specified postprocedural states 05/28/2012  . BP (high blood pressure) 05/19/2012  . Endometriosis in cutaneous scar 11/08/2011    Past Surgical History:  Procedure Laterality Date  . ABDOMINAL HYSTERECTOMY     2014 TAH with bilateral BSO  . ENDOMETRIAL BIOPSY      OB History   No obstetric history on file.      Home Medications    Prior to Admission medications   Medication Sig Start Date End Date Taking? Authorizing Provider  amLODipine (NORVASC) 10 MG tablet Take 5mg  (0.5 tab) PO daily 01/12/20  Yes Burnette, Jennifer M, PA-C  ibuprofen (ADVIL,MOTRIN) 800 MG tablet Take 1 tablet (800 mg total) by mouth every 8 (eight) hours as needed for moderate pain. 12/20/16   Paulette Blanch, MD    Family  History History reviewed. No pertinent family history.  Social History Social History   Tobacco Use  . Smoking status: Never Smoker  . Smokeless tobacco: Never Used  Vaping Use  . Vaping Use: Never used  Substance Use Topics  . Alcohol use: Yes    Alcohol/week: 0.0 standard drinks    Comment: sometimes  . Drug use: No     Allergies   Patient has no known allergies.   Review of Systems Review of Systems  Respiratory: Negative for shortness of breath and wheezing.   Cardiovascular: Negative for chest pain, palpitations and leg swelling.  Skin: Negative.   Neurological: Negative for dizziness, syncope and headaches.  Hematological: Negative.   Psychiatric/Behavioral: Negative.      Physical Exam Triage Vital Signs ED Triage Vitals  Enc Vitals Group     BP 06/10/20 1428 131/90     Pulse Rate 06/10/20 1428 72     Resp 06/10/20 1428 14     Temp 06/10/20 1428 98.3 F (36.8 C)     Temp Source 06/10/20 1428 Oral     SpO2 06/10/20 1428 100 %     Weight 06/10/20 1425 206 lb (93.4 kg)     Height 06/10/20 1425 5\' 5"  (1.651 m)     Head Circumference --      Peak Flow --  Pain Score 06/10/20 1425 0     Pain Loc --      Pain Edu? --      Excl. in Pace? --    No data found.  Updated Vital Signs BP 131/90 (BP Location: Left Arm)   Pulse 72   Temp 98.3 F (36.8 C) (Oral)   Resp 14   Ht 5\' 5"  (1.651 m)   Wt 206 lb (93.4 kg)   SpO2 100%   BMI 34.28 kg/m   Visual Acuity Right Eye Distance:   Left Eye Distance:   Bilateral Distance:    Right Eye Near:   Left Eye Near:    Bilateral Near:     Physical Exam Vitals and nursing note reviewed.  Constitutional:      Appearance: Normal appearance.  HENT:     Head: Normocephalic and atraumatic.  Cardiovascular:     Rate and Rhythm: Normal rate and regular rhythm.     Pulses: Normal pulses.     Heart sounds: Normal heart sounds. No murmur heard. No gallop.   Pulmonary:     Effort: Pulmonary effort is normal.      Breath sounds: Normal breath sounds. No wheezing, rhonchi or rales.  Skin:    General: Skin is warm and dry.     Capillary Refill: Capillary refill takes less than 2 seconds.     Findings: No erythema or rash.  Neurological:     General: No focal deficit present.     Mental Status: She is alert and oriented to person, place, and time.  Psychiatric:        Mood and Affect: Mood normal.        Behavior: Behavior normal.        Thought Content: Thought content normal.        Judgment: Judgment normal.      UC Treatments / Results  Labs (all labs ordered are listed, but only abnormal results are displayed) Labs Reviewed - No data to display  EKG   Radiology No results found.  Procedures Procedures (including critical care time)  Medications Ordered in UC Medications - No data to display  Initial Impression / Assessment and Plan / UC Course  I have reviewed the triage vital signs and the nursing notes.  Pertinent labs & imaging results that were available during my care of the patient were reviewed by me and considered in my medical decision making (see chart for details).   Patient is a very pleasant 55 year old female here for evaluation of elevated blood pressure.  Patient is a history of high blood pressure and has been on amlodipine 5 mg.  2 days ago she increased her amlodipine to 10 mg and states that she has noticed an improvement in her blood pressure.  Patient denies chest pain, syncope, dizziness, or shortness of breath.  Physical exam heart is S1-S2 and crisp, lung sounds are clear auscultation all fields.  No peripheral edema.  Peripheral pulses are 2+ globally.  Patient advised to continue taking amlodipine 10 mg daily, monitor her blood pressure daily and we discussed proper technique for taking her blood pressure, and ER precautions were discussed with patient.   Final Clinical Impressions(s) / UC Diagnoses   Final diagnoses:  Hypertension, unspecified  type     Discharge Instructions     Continue taking amlodipine 10 mg daily for treatment of your high blood pressure.  Monitor and record your blood pressure daily.  Do not do this first thing  in the morning and do not do it within 1 hour of drinking caffeine.  We did take your blood pressure make sure that your bladder is empty, you are sitting with your feet flat on the floor, and the arm that you are taking the reading and is at heart level for 5 minutes.  If you develop any chest pain, shortness breath, dizziness, or visual changes you need to go to the ER for evaluation.    ED Prescriptions    None     PDMP not reviewed this encounter.   Margarette Canada, NP 06/10/20 [phone]

## 2020-06-10 NOTE — Discharge Instructions (Addendum)
Continue taking amlodipine 10 mg daily for treatment of your high blood pressure.  Monitor and record your blood pressure daily.  Do not do this first thing in the morning and do not do it within 1 hour of drinking caffeine.  We did take your blood pressure make sure that your bladder is empty, you are sitting with your feet flat on the floor, and the arm that you are taking the reading and is at heart level for 5 minutes.  If you develop any chest pain, shortness breath, dizziness, or visual changes you need to go to the ER for evaluation.

## 2020-06-10 NOTE — ED Triage Notes (Signed)
Patient states that she is treated for HTN.  Patient states that at work she checked her BP on Tuesday  148/105.  Patient states that her mother was diagnosed with stage 4 cancer and has been under stressful time.

## 2020-06-21 ENCOUNTER — Ambulatory Visit: Payer: Self-pay | Admitting: *Deleted

## 2020-06-21 ENCOUNTER — Ambulatory Visit: Payer: BC Managed Care – PPO | Admitting: Adult Health

## 2020-06-21 ENCOUNTER — Ambulatory Visit: Payer: Self-pay | Admitting: Adult Health

## 2020-06-21 NOTE — Progress Notes (Signed)
Telephone Visit    Virtual Visit via Video Note   This visit type was conducted due to national recommendations for restrictions regarding the COVID-19 Pandemic (e.g. social distancing) in an effort to limit this patient's exposure and mitigate transmission in our community. This patient is at least at moderate risk for complications without adequate follow up. This format is felt to be most appropriate for this patient at this time. Physical exam was limited by quality of the video and audio technology used for the visit.   Patient location: At home no family members present Provider location: Home office  I discussed the limitations of evaluation and management by telemedicine and the availability of in person appointments. The patient expressed understanding and agreed to proceed.  Patient: Michelle Shea   DOB: 10/26/1965   55 y.o. Female  MRN: [id number] Visit Date: 06/22/2020  Today's healthcare provider: Laurita Quint Just, FNP   Chief Complaint  Patient presents with  . Hypertension   Subjective    HPI  Hypertension, follow-up  BP Readings from Last 3 Encounters:  06/10/20 131/90  12/11/19 129/88  11/04/18 124/90   Wt Readings from Last 3 Encounters:  06/10/20 206 lb (93.4 kg)  12/11/19 217 lb 3.2 oz (98.5 kg)  11/04/18 212 lb (96.2 kg)     She was last seen for hypertension 7 months ago.  Management since that visit includes an Urgent Care visit on 06/10/2020 secondary to elevated BP.  Pt's amlodipine was increase to 10mg  daily.   Mother recently diagnosed with stage 4 cancer Has had increased stress due to caretaking duties Her Diastolic has been 132-440 Was at Amlodipine 5 mg previously She Increased to 10 mg on the 21st due to concerns for a high diastolic Has seen improvement since then     Ran out of her medication today Was on HCTZ in the past stopped due to leg cramps Takes BP at home, most days, hasn't taken in a few days Recently had an MRI done and  oncologist thru [facility] ordered it Everything looked good on the MRI per patient Would also like her iron checked to determine if she needs to restart her iron pills    She reports excellent compliance with treatment. She is not having side effects.  She is following a Low Sodium diet. She is exercising. She does not smoke.  Use of agents associated with hypertension: none.   Outside blood pressures are still elevated at home, but pt also reports being under a lot of stress recently.  Symptoms: No chest pain No chest pressure  No palpitations No syncope  No dyspnea No orthopnea  No paroxysmal nocturnal dyspnea No lower extremity edema   Pertinent labs: Lab Results  Component Value Date   CHOL 194 10/02/2018   HDL 46 10/02/2018   LDLCALC 125 (H) 10/02/2018   TRIG 116 10/02/2018   CHOLHDL 4.2 10/02/2018   Lab Results  Component Value Date   NA 142 11/04/2018   K 3.8 11/04/2018   CREATININE 0.78 11/04/2018   GFRNONAA 87 11/04/2018   GFRAA 100 11/04/2018   GLUCOSE 97 11/04/2018     The 10-year ASCVD risk score Mikey Bussing DC Jr., et al., 2013) is: 2.9%   ---------------------------------------------------------------------------------------------------   Patient Active Problem List   Diagnosis Date Noted  . History of anemia 11/03/2015  . Ovarian cancer ([place]) 12/03/2014  . Cancer of abdominal wall 07/07/2012  . Malignant neoplasm of peritoneum ([place]) 07/07/2012  . Other specified postprocedural  states 05/28/2012  . BP (high blood pressure) 05/19/2012  . Endometriosis in cutaneous scar 11/08/2011   Past Medical History:  Diagnosis Date  . Anemia   . Endometriosis   . Hypertension   . Ovarian cancer [facility])    Social History   Tobacco Use  . Smoking status: Never Smoker  . Smokeless tobacco: Never Used  Vaping Use  . Vaping Use: Never used  Substance Use Topics  . Alcohol use: Yes    Alcohol/week: 0.0 standard drinks    Comment: sometimes  . Drug use: No   No  Known Allergies  Medications: Outpatient Medications Prior to Visit  Medication Sig  . ibuprofen (ADVIL,MOTRIN) 800 MG tablet Take 1 tablet (800 mg total) by mouth every 8 (eight) hours as needed for moderate pain.  . [DISCONTINUED] amLODipine (NORVASC) 10 MG tablet Take 5mg  (0.5 tab) PO daily   No facility-administered medications prior to visit.    Review of Systems  Constitutional: Positive for fatigue. Negative for activity change, appetite change, chills, diaphoresis, fever and unexpected weight change.  Respiratory: Negative.   Cardiovascular: Negative.   Gastrointestinal: Negative.   Neurological: Negative for dizziness, syncope, speech difficulty, weakness, light-headedness and headaches.       Mind feels cloudy at times      Objective    There were no vitals taken for this visit.   Physical Exam Pulmonary:     Effort: Pulmonary effort is normal. No respiratory distress.  Neurological:     Mental Status: She is alert and oriented to person, place, and time.  Psychiatric:        Behavior: Behavior normal.     Physical exam limited due to virtual visit.   Assessment & Plan     Problem List Items Addressed This Visit      Other   History of anemia - Primary   Relevant Orders   CBC   Iron, TIBC and Ferritin Panel    Other Visit Diagnoses    Essential hypertension       Relevant Medications   amLODipine (NORVASC) 10 MG tablet   Other Relevant Orders   Comprehensive metabolic panel   Lipid Panel     Plan Continue Amlodipine at 10 mg a day, refills sent to pharmacy Continue checking BP at home daily BP goal< 130/80 Lab orders placed, will follow up with results for iron Needs physical to update health screening  Return in about 2 months (around 08/22/2020) for Physical.     I discussed the assessment and treatment plan with the patient. The patient was provided an opportunity to ask questions and all were answered. The patient agreed with the plan and  demonstrated an understanding of the instructions.   The patient was advised to call back or seek an in-person evaluation if the symptoms worsen or if the condition fails to improve as anticipated.  I provided 20 minutes of non-face-to-face time during this encounter.   [place], [place] [phone] (phone) [phone] (fax)  [place]

## 2020-06-21 NOTE — Telephone Encounter (Signed)
Per initial encounter, "Patient scheduled for BP check today but provider will not be in office therefore it needed to be [facility]. Patient denied to [facility] due to having to check her work schedule. Patient BP has been running for the last week 138/106 and the highest 148/108. Not currently experiencing head aches and dizziness but wanted to discuss her BP medication. Seeking clinical advice"; contacted pt to discuss her concerns; the pt states she has already spoken with someone and is scheduled for a virtual appt on 06/22/20 at 0800.  Reason for Disposition . [1] Follow-up call to recent contact AND [2] information only call, no triage required  Protocols used: INFORMATION ONLY CALL - NO TRIAGE-A-AH

## 2020-06-22 ENCOUNTER — Telehealth (INDEPENDENT_AMBULATORY_CARE_PROVIDER_SITE_OTHER): Payer: No Typology Code available for payment source | Admitting: Family Medicine

## 2020-06-22 ENCOUNTER — Encounter: Payer: Self-pay | Admitting: Family Medicine

## 2020-06-22 DIAGNOSIS — I10 Essential (primary) hypertension: Secondary | ICD-10-CM

## 2020-06-22 DIAGNOSIS — Z862 Personal history of diseases of the blood and blood-forming organs and certain disorders involving the immune mechanism: Secondary | ICD-10-CM

## 2020-06-22 MED ORDER — AMLODIPINE BESYLATE 10 MG PO TABS: 10.0000 mg | ORAL_TABLET | Freq: Every day | ORAL | 3 refills | Status: DC

## 2020-06-22 NOTE — Patient Instructions (Signed)

## 2020-06-22 NOTE — Addendum Note (Signed)
Addended by: Bari Edward on: 06/22/2020 02:48 PM   Modules accepted: Orders

## 2020-08-01 ENCOUNTER — Ambulatory Visit (INDEPENDENT_AMBULATORY_CARE_PROVIDER_SITE_OTHER): Payer: No Typology Code available for payment source | Admitting: Family Medicine

## 2020-08-01 ENCOUNTER — Encounter: Payer: Self-pay | Admitting: Family Medicine

## 2020-08-01 ENCOUNTER — Other Ambulatory Visit: Payer: Self-pay

## 2020-08-01 VITALS — BP 148/89 | HR 67 | Temp 98.2°F

## 2020-08-01 DIAGNOSIS — N390 Urinary tract infection, site not specified: Secondary | ICD-10-CM

## 2020-08-01 DIAGNOSIS — R8290 Unspecified abnormal findings in urine: Secondary | ICD-10-CM

## 2020-08-01 LAB — POCT URINALYSIS DIPSTICK
Bilirubin, UA: NEGATIVE
Blood, UA: NEGATIVE
Glucose, UA: NEGATIVE
Nitrite, UA: POSITIVE
Odor: POSITIVE
Protein, UA: POSITIVE — AB
Spec Grav, UA: 1.02 (ref 1.010–1.025)
Urobilinogen, UA: NEGATIVE E.U./dL — AB
pH, UA: 6.5 (ref 5.0–8.0)

## 2020-08-01 MED ORDER — SULFAMETHOXAZOLE-TRIMETHOPRIM 800-160 MG PO TABS: 1.0000 | ORAL_TABLET | Freq: Two times a day (BID) | ORAL | 0 refills | Status: DC

## 2020-08-01 NOTE — Progress Notes (Signed)
Acute Office Visit  Subjective:    Patient ID: Michelle Shea, female    DOB: 1966-02-01, 55 y.o.   MRN: [id number]  No chief complaint on file.   HPI Patient is in today for possible urinary infection.  Patient states that she has had a foul smelling urine.  Other than that and some increased urinating at night she has not other symptoms to suggest infection.  She is concerned and states she may be being paranoid because her mother recently passed away from pancreatic cancer and she had foul smelling urine.  Past Medical History:  Diagnosis Date  . Anemia   . Endometriosis   . Hypertension   . Ovarian cancer [facility])     Past Surgical History:  Procedure Laterality Date  . ABDOMINAL HYSTERECTOMY     2014 TAH with bilateral BSO  . ENDOMETRIAL BIOPSY      No family history on file.  Social History   Socioeconomic History  . Marital status: Single    Spouse name: Not on file  . Number of children: Not on file  . Years of education: Not on file  . Highest education level: Not on file  Occupational History  . Not on file  Tobacco Use  . Smoking status: Never Smoker  . Smokeless tobacco: Never Used  Vaping Use  . Vaping Use: Never used  Substance and Sexual Activity  . Alcohol use: Yes    Alcohol/week: 0.0 standard drinks    Comment: sometimes  . Drug use: No  . Sexual activity: Not on file  Other Topics Concern  . Not on file  Social History Narrative  . Not on file   Social Determinants of Health   Financial Resource Strain: Not on file  Food Insecurity: Not on file  Transportation Needs: Not on file  Physical Activity: Not on file  Stress: Not on file  Social Connections: Not on file  Intimate Partner Violence: Not on file    Outpatient Medications Prior to Visit  Medication Sig Dispense Refill  . amLODipine (NORVASC) 10 MG tablet Take 1 tablet (10 mg total) by mouth daily. 90 tablet 3  . ibuprofen (ADVIL,MOTRIN) 800 MG tablet Take 1 tablet (800 mg  total) by mouth every 8 (eight) hours as needed for moderate pain. 15 tablet 0   No facility-administered medications prior to visit.    No Known Allergies  Review of Systems  Genitourinary: Positive for frequency. Negative for difficulty urinating, dysuria, enuresis, flank pain, hematuria and urgency.  Musculoskeletal: Negative for back pain.       Objective:    Physical Exam  BP (!) 148/89 (BP Location: Right Arm, Patient Position: Sitting, Cuff Size: Normal)   Pulse 67   Temp 98.2 F (36.8 C) (Oral)   SpO2 99%   BP Readings from Last 3 Encounters:  08/01/20 (!) 148/89  06/10/20 131/90  12/11/19 129/88    Wt Readings from Last 3 Encounters:  06/10/20 206 lb (93.4 kg)  12/11/19 217 lb 3.2 oz (98.5 kg)  11/04/18 212 lb (96.2 kg)    Health Maintenance Due  Topic Date Due  . COVID-19 Vaccine (1) Never done  . Hepatitis C Screening  Never done  . COLONOSCOPY (Pts 45-26yrs Insurance coverage will need to be confirmed)  Never done  . MAMMOGRAM  04/12/2020    There are no preventive care reminders to display for this patient.   No results found for: TSH Lab Results  Component Value Date  WBC 7.9 10/02/2018   HGB 12.3 10/02/2018   HCT 36.4 10/02/2018   MCV 85 10/02/2018   PLT 271 10/02/2018   Lab Results  Component Value Date   NA 142 11/04/2018   K 3.8 11/04/2018   CO2 22 11/04/2018   GLUCOSE 97 11/04/2018   BUN 8 11/04/2018   CREATININE 0.78 11/04/2018   BILITOT 0.3 11/04/2018   ALKPHOS 137 (H) 11/04/2018   AST 15 11/04/2018   ALT 11 11/04/2018   PROT 6.7 11/04/2018   ALBUMIN 4.0 11/04/2018   CALCIUM 9.3 11/04/2018   Lab Results  Component Value Date   CHOL 194 10/02/2018   Lab Results  Component Value Date   HDL 46 10/02/2018   Lab Results  Component Value Date   LDLCALC 125 (H) 10/02/2018   Lab Results  Component Value Date   TRIG 116 10/02/2018   Lab Results  Component Value Date   CHOLHDL 4.2 10/02/2018   No results found for:  HGBA1C     Assessment & Plan:   1. Urine malodor Onset over a month ago. No discomfort. Some increase in frequency at night. No blood in urine or fever. Check urine C&S and increase fluid intake. - POCT urinalysis dipstick  2. Urinary tract infection without hematuria, site unspecified Positive nitrites. Will get culture and start antibiotic treatment. Recheck pending report. - Urine Culture - sulfamethoxazole-trimethoprim (BACTRIM DS) 800-160 MG tablet; Take 1 tablet by mouth 2 (two) times daily.  Dispense: 14 tablet; Refill: 0       Juluis Mire, CMA   I, Eliab Closson, PA-C, have reviewed all documentation for this visit. The documentation on 08/01/20 for the exam, diagnosis, procedures, and orders are all accurate and complete.

## 2020-08-01 NOTE — Patient Instructions (Signed)
Urinary Tract Infection, Adult  A urinary tract infection (UTI) is an infection of any part of the urinary tract. The urinary tract includes the kidneys, ureters, bladder, and urethra. These organs make, store, and get rid of urine in the body. An upper UTI affects the ureters and kidneys. A lower UTI affects the bladder and urethra. What are the causes? Most urinary tract infections are caused by bacteria in your genital area around your urethra, where urine leaves your body. These bacteria grow and cause inflammation of your urinary tract. What increases the risk? You are more likely to develop this condition if:  You have a urinary catheter that stays in place.  You are not able to control when you urinate or have a bowel movement (incontinence).  You are female and you: ? Use a spermicide or diaphragm for birth control. ? Have low estrogen levels. ? Are pregnant.  You have certain genes that increase your risk.  You are sexually active.  You take antibiotic medicines.  You have a condition that causes your flow of urine to slow down, such as: ? An enlarged prostate, if you are female. ? Blockage in your urethra. ? A kidney stone. ? A nerve condition that affects your bladder control (neurogenic bladder). ? Not getting enough to drink, or not urinating often.  You have certain medical conditions, such as: ? Diabetes. ? A weak disease-fighting system (immunesystem). ? Sickle cell disease. ? Gout. ? Spinal cord injury. What are the signs or symptoms? Symptoms of this condition include:  Needing to urinate right away (urgency).  Frequent urination. This may include small amounts of urine each time you urinate.  Pain or burning with urination.  Blood in the urine.  Urine that smells bad or unusual.  Trouble urinating.  Cloudy urine.  Vaginal discharge, if you are female.  Pain in the abdomen or the lower back. You may also have:  Vomiting or a decreased  appetite.  Confusion.  Irritability or tiredness.  A fever or chills.  Diarrhea. The first symptom in older adults may be confusion. In some cases, they may not have any symptoms until the infection has worsened. How is this diagnosed? This condition is diagnosed based on your medical history and a physical exam. You may also have other tests, including:  Urine tests.  Blood tests.  Tests for STIs (sexually transmitted infections). If you have had more than one UTI, a cystoscopy or imaging studies may be done to determine the cause of the infections. How is this treated? Treatment for this condition includes:  Antibiotic medicine.  Over-the-counter medicines to treat discomfort.  Drinking enough water to stay hydrated. If you have frequent infections or have other conditions such as a kidney stone, you may need to see a health care provider who specializes in the urinary tract (urologist). In rare cases, urinary tract infections can cause sepsis. Sepsis is a life-threatening condition that occurs when the body responds to an infection. Sepsis is treated in the hospital with IV antibiotics, fluids, and other medicines. Follow these instructions at home: Medicines  Take over-the-counter and prescription medicines only as told by your health care provider.  If you were prescribed an antibiotic medicine, take it as told by your health care provider. Do not stop using the antibiotic even if you start to feel better. General instructions  Make sure you: ? Empty your bladder often and completely. Do not hold urine for long periods of time. ? Empty your bladder after   sex. ? Wipe from front to back after urinating or having a bowel movement if you are female. Use each tissue only one time when you wipe.  Drink enough fluid to keep your urine pale yellow.  Keep all follow-up visits. This is important.   Contact a health care provider if:  Your symptoms do not get better after 1-2  days.  Your symptoms go away and then return. Get help right away if:  You have severe pain in your back or your lower abdomen.  You have a fever or chills.  You have nausea or vomiting. Summary  A urinary tract infection (UTI) is an infection of any part of the urinary tract, which includes the kidneys, ureters, bladder, and urethra.  Most urinary tract infections are caused by bacteria in your genital area.  Treatment for this condition often includes antibiotic medicines.  If you were prescribed an antibiotic medicine, take it as told by your health care provider. Do not stop using the antibiotic even if you start to feel better.  Keep all follow-up visits. This is important. This information is not intended to replace advice given to you by your health care provider. Make sure you discuss any questions you have with your health care provider. Document Revised: 10/16/2019 Document Reviewed: 10/16/2019 Elsevier Patient Education  2021 Elsevier Inc.  

## 2020-08-04 ENCOUNTER — Encounter: Payer: Self-pay | Admitting: Family Medicine

## 2020-08-04 LAB — SPECIMEN STATUS REPORT

## 2020-08-04 LAB — URINE CULTURE

## 2020-09-15 DIAGNOSIS — R188 Other ascites: Secondary | ICD-10-CM | POA: Insufficient documentation

## 2021-04-18 ENCOUNTER — Ambulatory Visit: Payer: No Typology Code available for payment source | Admitting: Family Medicine

## 2021-04-25 ENCOUNTER — Encounter: Payer: Self-pay | Admitting: Physician Assistant

## 2021-04-25 ENCOUNTER — Other Ambulatory Visit: Payer: Self-pay

## 2021-04-25 ENCOUNTER — Ambulatory Visit (INDEPENDENT_AMBULATORY_CARE_PROVIDER_SITE_OTHER): Payer: No Typology Code available for payment source | Admitting: Physician Assistant

## 2021-04-25 VITALS — BP 131/93 | HR 73 | Temp 98.1°F | Resp 16 | Wt 206.0 lb

## 2021-04-25 DIAGNOSIS — Z1211 Encounter for screening for malignant neoplasm of colon: Secondary | ICD-10-CM | POA: Diagnosis not present

## 2021-04-25 DIAGNOSIS — Z1231 Encounter for screening mammogram for malignant neoplasm of breast: Secondary | ICD-10-CM

## 2021-04-25 DIAGNOSIS — M25611 Stiffness of right shoulder, not elsewhere classified: Secondary | ICD-10-CM | POA: Diagnosis not present

## 2021-04-25 NOTE — Progress Notes (Signed)
Established patient visit   Patient: Michelle Shea   DOB: [birth year]   56 y.o. Female  MRN: [id number] Visit Date: 04/25/2021  Today's healthcare provider: Dani Gobble Trysten Bernard, PA-C  Introduced myself to the patient as a Journalist, newspaper and provided education on APPs in clinical practice.    Chief Complaint  Patient presents with   Breast Problem   I,Michelle Shea,acting as a scribe for [employer], PA-C.,have documented all relevant documentation on the behalf of [place], PA-C,as directed by  Junie Panning E Lotta [place], PA-C while in the presence of Jaymz Traywick E Ninfa Giannelli, PA-C.  Subjective    HPI  Patient here today C/O muscle pain/tingling on right breast radiating to right shoulder and arm x 2 weeks. Patient reports she has been doing some work and has been climbing on ladder in the last few days.   States that she had to climb on a ladder in an odd position last week and started noticing the tingling sensation in her right breast, right arm, and right axilla States it is more uncomfortable, like a muscle strain than outright pain    Medications: Outpatient Medications Prior to Visit  Medication Sig   amLODipine (NORVASC) 10 MG tablet Take 1 tablet (10 mg total) by mouth daily.   sulfamethoxazole-trimethoprim (BACTRIM DS) 800-160 MG tablet Take 1 tablet by mouth 2 (two) times daily. (Patient not taking: Reported on 04/25/2021)   No facility-administered medications prior to visit.    Review of Systems  Constitutional:  Negative for appetite change, chills, fatigue, fever and unexpected weight change.  Respiratory:  Negative for cough, shortness of breath and wheezing.   Cardiovascular:  Negative for chest pain and palpitations.  Musculoskeletal:  Positive for myalgias. Negative for back pain.  Neurological:  Positive for numbness.       Objective    There were no vitals taken for this visit. BP Readings from Last 3 Encounters:  08/01/20 (!) 148/89  06/10/20 131/90  12/11/19 129/88   Wt  Readings from Last 3 Encounters:  06/10/20 206 lb (93.4 kg)  12/11/19 217 lb 3.2 oz (98.5 kg)  11/04/18 212 lb (96.2 kg)      Physical Exam Vitals reviewed. Exam conducted with a chaperone present.  Chest:     Chest wall: No mass, lacerations, deformity or tenderness.  Breasts:    Tanner Score is 5.     Breasts are symmetrical.     Right: Normal.     Left: Normal.  Musculoskeletal:     Right shoulder: No swelling, deformity or tenderness. Decreased range of motion. Normal strength.     Left shoulder: Normal. No tenderness. Normal range of motion. Normal strength.     Right hand: Normal strength. Normal capillary refill. Normal pulse.     Left hand: Normal strength. Normal capillary refill. Normal pulse.     Comments: Right - Mild decreased ROM with external rotation- stiffness with general shoulder ROM on right side  Left - normal ROM   Lymphadenopathy:     Upper Body:     Right upper body: No supraclavicular, axillary or pectoral adenopathy.     Left upper body: No supraclavicular, axillary or pectoral adenopathy.      No results found for any visits on 04/25/21.  Assessment & Plan      1. Shoulder stiffness, right Acute, new problem, stable  Patient reports shoulder stiffness and discomfort extending into the right chest, axilla and breast area Reports concerns  for breast cancer due to these sensations in right breast Reassured with exam today being overall benign  Recommend gentle shoulder stretches and OTC pain medications as needed for discomfort  Recommend using proper lifting techniques at work and gentle stretches to prevent shoulder stiffness Follow up as needed for worsening symptoms    2. Encounter for screening mammogram for malignant neoplasm of breast - MM 3D SCREEN BREAST BILATERAL  3. Colon cancer screening - Ambulatory referral to Gastroenterology    The entirety of the information documented in the History of Present Illness, Review of Systems  and Physical Exam were personally obtained by me. Portions of this information were initially documented by the CMA and reviewed by me for thoroughness and accuracy.   Dani Gobble Yuto Cajuste, PA-C   Almon Register, PA-C  [employer] [phone] (phone) [phone] (fax)  [place]

## 2021-04-26 ENCOUNTER — Other Ambulatory Visit: Payer: Self-pay

## 2021-04-26 DIAGNOSIS — Z1211 Encounter for screening for malignant neoplasm of colon: Secondary | ICD-10-CM

## 2021-04-26 MED ORDER — CLENPIQ 10-3.5-12 MG-GM -GM/160ML PO SOLN: 1.0000 | Freq: Once | ORAL | 0 refills | Status: AC

## 2021-04-26 NOTE — Progress Notes (Signed)
Gastroenterology Pre-Procedure Review  Request Date: 05/09/2021 Requesting Physician: Dr. Vicente Males  PATIENT REVIEW QUESTIONS: The patient responded to the following health history questions as indicated:    1. Are you having any GI issues? no 2. Do you have a personal history of Polyps?  Unsure of last colonoscopy. 3. Do you have a family history of Colon Cancer or Polyps? no 4. Diabetes Mellitus? no 5. Joint replacements in the past 12 months?no 6. Major health problems in the past 3 months?no 7. Any artificial heart valves, MVP, or defibrillator?no    MEDICATIONS & ALLERGIES:    Patient reports the following regarding taking any anticoagulation/antiplatelet therapy:   Plavix, Coumadin, Eliquis, Xarelto, Lovenox, Pradaxa, Brilinta, or Effient? no Aspirin? no  Patient confirms/reports the following medications:  Current Outpatient Medications  Medication Sig Dispense Refill   amLODipine (NORVASC) 10 MG tablet Take 1 tablet (10 mg total) by mouth daily. 90 tablet 3   No current facility-administered medications for this visit.    Patient confirms/reports the following allergies:  No Known Allergies  No orders of the defined types were placed in this encounter.   AUTHORIZATION INFORMATION Primary Insurance: 1D#: Group #:  Secondary Insurance: 1D#: Group #:  SCHEDULE INFORMATION: Date: 05/09/2021 Time: Location: [facility]

## 2021-05-09 ENCOUNTER — Ambulatory Visit: Payer: No Typology Code available for payment source | Admitting: Anesthesiology

## 2021-05-09 ENCOUNTER — Ambulatory Visit
Admission: RE | Admit: 2021-05-09 | Discharge: 2021-05-09 | Disposition: A | Discharge status: Home / Self Care | Payer: No Typology Code available for payment source | Source: Ambulatory Visit | Attending: Gastroenterology | Admitting: Gastroenterology

## 2021-05-09 ENCOUNTER — Encounter
Admission: RE | Disposition: A | Discharge status: Home / Self Care | Payer: Self-pay | Source: Ambulatory Visit | Attending: Gastroenterology

## 2021-05-09 ENCOUNTER — Encounter: Payer: Self-pay | Admitting: Gastroenterology

## 2021-05-09 DIAGNOSIS — I10 Essential (primary) hypertension: Secondary | ICD-10-CM | POA: Insufficient documentation

## 2021-05-09 DIAGNOSIS — D122 Benign neoplasm of ascending colon: Secondary | ICD-10-CM | POA: Diagnosis not present

## 2021-05-09 DIAGNOSIS — D125 Benign neoplasm of sigmoid colon: Secondary | ICD-10-CM | POA: Diagnosis not present

## 2021-05-09 DIAGNOSIS — D120 Benign neoplasm of cecum: Secondary | ICD-10-CM | POA: Diagnosis not present

## 2021-05-09 DIAGNOSIS — Z79899 Other long term (current) drug therapy: Secondary | ICD-10-CM | POA: Insufficient documentation

## 2021-05-09 DIAGNOSIS — D124 Benign neoplasm of descending colon: Secondary | ICD-10-CM | POA: Diagnosis not present

## 2021-05-09 DIAGNOSIS — K635 Polyp of colon: Secondary | ICD-10-CM

## 2021-05-09 DIAGNOSIS — Z1211 Encounter for screening for malignant neoplasm of colon: Secondary | ICD-10-CM

## 2021-05-09 HISTORY — PX: COLONOSCOPY WITH PROPOFOL: SHX5780

## 2021-05-09 SURGERY — COLONOSCOPY WITH PROPOFOL
Anesthesia: General

## 2021-05-09 MED ORDER — LIDOCAINE HCL (CARDIAC) PF 100 MG/5ML IV SOSY
PREFILLED_SYRINGE | INTRAVENOUS | Status: DC | PRN
  Administered 2021-05-09: 50 mg via INTRAVENOUS

## 2021-05-09 MED ORDER — SODIUM CHLORIDE 0.9 % IV SOLN: INTRAVENOUS | Status: DC

## 2021-05-09 MED ORDER — PROPOFOL 10 MG/ML IV BOLUS
INTRAVENOUS | Status: AC
Start: 2021-05-09 — End: ?
  Filled 2021-05-09: qty 20

## 2021-05-09 MED ORDER — SODIUM CHLORIDE (PF) 0.9 % IJ SOLN
INTRAMUSCULAR | Status: DC | PRN
  Administered 2021-05-09: 4 mL via INTRAVENOUS

## 2021-05-09 MED ORDER — PROPOFOL 500 MG/50ML IV EMUL
INTRAVENOUS | Status: AC
  Filled 2021-05-09: qty 50

## 2021-05-09 MED ORDER — PROPOFOL 10 MG/ML IV BOLUS
INTRAVENOUS | Status: DC | PRN
  Administered 2021-05-09: 100 mg via INTRAVENOUS

## 2021-05-09 MED ORDER — PROPOFOL 500 MG/50ML IV EMUL
INTRAVENOUS | Status: DC | PRN
  Administered 2021-05-09: 120 ug/kg/min via INTRAVENOUS

## 2021-05-09 NOTE — Transfer of Care (Signed)
Immediate Anesthesia Transfer of Care Note  Patient: Michelle Shea  Procedure(s) Performed: COLONOSCOPY WITH PROPOFOL  Patient Location: PACU and Endoscopy Unit  Anesthesia Type:General  Level of Consciousness: drowsy and patient cooperative  Airway & Oxygen Therapy: Patient Spontanous Breathing  Post-op Assessment: Report given to RN and Post -op Vital signs reviewed and stable  Post vital signs: Reviewed and stable  Last Vitals:  Vitals Value Taken Time  BP 124/90 05/09/21 1020  Temp    Pulse 67 05/09/21 1020  Resp 15 05/09/21 1020  SpO2 96 % 05/09/21 1020    Last Pain:  Vitals:   05/09/21 0838  TempSrc: Temporal  PainSc: 0-No pain         Complications: No notable events documented.

## 2021-05-09 NOTE — Op Note (Signed)
[facility] Patient Name: Michelle Shea Procedure Date: 05/09/2021 9:35 AM MRN: [id number] Account #: [id number] Date of Birth: 1965/11/13 Admit Type: Outpatient Age: 56 Room: [facility] ENDO ROOM 2 Gender: Female Note Status: Finalized Instrument Name: Jasper Riling [id number] Procedure:             Colonoscopy Indications:           Screening for colorectal malignant neoplasm Providers:             Jonathon Bellows MD, MD Referring MD:          Dr Rollene Rotunda, MD (Referring MD) Medicines:             Monitored Anesthesia Care Complications:         No immediate complications. Procedure:             Pre-Anesthesia Assessment:                        - Prior to the procedure, a History and Physical was                         performed, and patient medications, allergies and                         sensitivities were reviewed. The patient's tolerance                         of previous anesthesia was reviewed.                        - The risks and benefits of the procedure and the                         sedation options and risks were discussed with the                         patient. All questions were answered and informed                         consent was obtained.                        - ASA Grade Assessment: II - A patient with mild                         systemic disease.                        After obtaining informed consent, the colonoscope was                         passed under direct vision. Throughout the procedure,                         the patient's blood pressure, pulse, and oxygen                         saturations were monitored continuously. The                         Colonoscope was introduced  through the anus and                         advanced to the the cecum, identified by the                         appendiceal orifice. The colonoscopy was performed                         with ease. The patient tolerated the procedure well.                          The quality of the bowel preparation was good. Findings:      The perianal and digital rectal examinations were normal.      A 5 mm polyp was found in the cecum. The polyp was sessile. The polyp       was removed with a cold snare. Resection and retrieval were complete.      Three sessile polyps were found in the ascending colon. The polyps were       5 to 7 mm in size. These polyps were removed with a cold snare.       Resection and retrieval were complete.      A 12 mm polyp was found in the descending colon. The polyp was sessile.       The polyp was removed with a saline injection-lift technique using a hot       snare. Resection and retrieval were complete. To prevent bleeding after       the polypectomy, one hemostatic clip was successfully placed. There was       no bleeding during, or at the end, of the procedure.      Three sessile polyps were found in the sigmoid colon. The polyps were 5       to 7 mm in size. These polyps were removed with a cold snare. Resection       and retrieval were complete.      The exam was otherwise without abnormality on direct and retroflexion       views. Impression:            - One 5 mm polyp in the cecum, removed with a cold                         snare. Resected and retrieved.                        - Three 5 to 7 mm polyps in the ascending colon,                         removed with a cold snare. Resected and retrieved.                        - One 12 mm polyp in the descending colon, removed                         using injection-lift and a hot snare. Resected and                         retrieved. Clip was placed.                        -  Three 5 to 7 mm polyps in the sigmoid colon, removed                         with a cold snare. Resected and retrieved.                        - The examination was otherwise normal on direct and                         retroflexion views. Recommendation:        - Discharge patient to  home (with escort).                        - Resume previous diet.                        - Continue present medications.                        - Await pathology results.                        - Repeat colonoscopy in 2 years for surveillance. Procedure Code(s):     --- Professional ---                        [id number], Colonoscopy, flexible; with removal of                         tumor(s), polyp(s), or other lesion(s) by snare                         technique                        45381, Colonoscopy, flexible; with directed submucosal                         injection(s), any substance Diagnosis Code(s):     --- Professional ---                        K63.5, Polyp of colon                        Z12.11, Encounter for screening for malignant neoplasm                         of colon CPT copyright 2019 American Medical Association. All rights reserved. The codes documented in this report are preliminary and upon coder review may  be revised to meet current compliance requirements. Jonathon Bellows, MD Jonathon Bellows MD, MD 05/09/2021 10:16:32 AM This report has been signed electronically. Number of Addenda: 0 Note Initiated On: 05/09/2021 9:35 AM Scope Withdrawal Time: 0 hours 24 minutes 11 seconds  Total Procedure Duration: 0 hours 26 minutes 45 seconds  Estimated Blood Loss:  Estimated blood loss: none.      [facility]

## 2021-05-09 NOTE — H&P (Signed)
° ° ° °  Jonathon Bellows, MD [address], [place], [place], [place], [address] [address], [place], [place], [place], [address] Phone: [phone]  Fax: [phone]  Primary Care Physician:  Gwyneth Sprout, FNP   Pre-Procedure History & Physical: HPI:  Michelle Shea is a 56 y.o. female is here for an colonoscopy.   Past Medical History:  Diagnosis Date   Anemia    Endometriosis    Hypertension    Ovarian cancer ([place])     Past Surgical History:  Procedure Laterality Date   ABDOMINAL HYSTERECTOMY     2014 TAH with bilateral BSO   ENDOMETRIAL BIOPSY      Prior to Admission medications   Medication Sig Start Date End Date Taking? Authorizing Provider  amLODipine (NORVASC) 10 MG tablet Take 1 tablet (10 mg total) by mouth daily. 06/22/20   Just, Laurita Quint, FNP    Allergies as of 04/26/2021   (No Known Allergies)    History reviewed. No pertinent family history.  Social History   Socioeconomic History   Marital status: Single    Spouse name: Not on file   Number of children: Not on file   Years of education: Not on file   Highest education level: Not on file  Occupational History   Not on file  Tobacco Use   Smoking status: Never   Smokeless tobacco: Never  Vaping Use   Vaping Use: Never used  Substance and Sexual Activity   Alcohol use: Yes    Alcohol/week: 0.0 standard drinks    Comment: sometimes   Drug use: No   Sexual activity: Not on file  Other Topics Concern   Not on file  Social History Narrative   Not on file   Social Determinants of Health   Financial Resource Strain: Not on file  Food Insecurity: Not on file  Transportation Needs: Not on file  Physical Activity: Not on file  Stress: Not on file  Social Connections: Not on file  Intimate Partner Violence: Not on file    Review of Systems: See HPI, otherwise negative ROS  Physical Exam: BP (!) 126/105    Pulse 65    Temp (!) 97.5 F (36.4 C) (Temporal)    Resp 16    Ht 5\' 4"  (1.626  m)    Wt 93.4 kg    SpO2 99%    BMI 35.36 kg/m  General:   Alert,  pleasant and cooperative in NAD Head:  Normocephalic and atraumatic. Neck:  Supple; no masses or thyromegaly. Lungs:  Clear throughout to auscultation, normal respiratory effort.    Heart:  +S1, +S2, Regular rate and rhythm, No edema. Abdomen:  Soft, nontender and nondistended. Normal bowel sounds, without guarding, and without rebound.   Neurologic:  Alert and  oriented x4;  grossly normal neurologically.  Impression/Plan: Michelle Shea is here for an colonoscopy to be performed for Screening colonoscopy average risk   Risks, benefits, limitations, and alternatives regarding  colonoscopy have been reviewed with the patient.  Questions have been answered.  All parties agreeable.   Jonathon Bellows, MD  05/09/2021, 9:39 AM

## 2021-05-09 NOTE — Anesthesia Postprocedure Evaluation (Signed)
Anesthesia Post Note  Patient: Michelle Shea  Procedure(s) Performed: COLONOSCOPY WITH PROPOFOL  Patient location during evaluation: Endoscopy Anesthesia Type: General Level of consciousness: awake and alert Pain management: pain level controlled Vital Signs Assessment: post-procedure vital signs reviewed and stable Respiratory status: spontaneous breathing, nonlabored ventilation, respiratory function stable and patient connected to nasal cannula oxygen Cardiovascular status: blood pressure returned to baseline and stable Postop Assessment: no apparent nausea or vomiting Anesthetic complications: no   No notable events documented.   Last Vitals:  Vitals:   05/09/21 1020 05/09/21 1030  BP: 124/90 124/90  Pulse: 67 64  Resp: 15 16  Temp:    SpO2: 96% 100%    Last Pain:  Vitals:   05/09/21 0838  TempSrc: Temporal  PainSc: 0-No pain                 Arita Miss

## 2021-05-09 NOTE — Anesthesia Preprocedure Evaluation (Signed)
Anesthesia Evaluation  Patient identified by MRN, date of birth, ID band Patient awake    Reviewed: Allergy & Precautions, NPO status , Patient's Chart, lab work & pertinent test results  History of Anesthesia Complications Negative for: history of anesthetic complications  Airway Mallampati: II  TM Distance: >3 FB Neck ROM: Full    Dental no notable dental hx. (+) Teeth Intact   Pulmonary neg pulmonary ROS, neg sleep apnea, neg COPD, Patient abstained from smoking.Not current smoker,    Pulmonary exam normal breath sounds clear to auscultation       Cardiovascular Exercise Tolerance: Good METShypertension, Pt. on medications (-) CAD and (-) Past MI (-) dysrhythmias  Rhythm:Regular Rate:Normal - Systolic murmurs    Neuro/Psych negative neurological ROS  negative psych ROS   GI/Hepatic neg GERD  ,(+)     (-) substance abuse  ,   Endo/Other  neg diabetes  Renal/GU negative Renal ROS     Musculoskeletal   Abdominal   Peds  Hematology   Anesthesia Other Findings Past Medical History: No date: Anemia No date: Endometriosis No date: Hypertension No date: Ovarian cancer ([place])  Reproductive/Obstetrics                             Anesthesia Physical Anesthesia Plan  ASA: 2  Anesthesia Plan: General   Post-op Pain Management: Minimal or no pain anticipated   Induction: Intravenous  PONV Risk Score and Plan: 3 and Propofol infusion, TIVA and Ondansetron  Airway Management Planned: Nasal Cannula  Additional Equipment: None  Intra-op Plan:   Post-operative Plan:   Informed Consent: I have reviewed the patients History and Physical, chart, labs and discussed the procedure including the risks, benefits and alternatives for the proposed anesthesia with the patient or authorized representative who has indicated his/her understanding and acceptance.     Dental advisory given  Plan  Discussed with: CRNA and Surgeon  Anesthesia Plan Comments: (Discussed risks of anesthesia with patient, including possibility of difficulty with spontaneous ventilation under anesthesia necessitating airway intervention, PONV, and rare risks such as cardiac or respiratory or neurological events, and allergic reactions. Discussed the role of CRNA in patient's perioperative care. Patient understands.)        Anesthesia Quick Evaluation

## 2021-05-10 ENCOUNTER — Encounter: Payer: Self-pay | Admitting: Gastroenterology

## 2021-05-11 ENCOUNTER — Ambulatory Visit
Admission: RE | Admit: 2021-05-11 | Discharge: 2021-05-11 | Disposition: A | Discharge status: Home / Self Care | Payer: No Typology Code available for payment source | Source: Ambulatory Visit | Attending: Physician Assistant | Admitting: Physician Assistant

## 2021-05-11 ENCOUNTER — Other Ambulatory Visit: Payer: Self-pay

## 2021-05-11 DIAGNOSIS — Z1231 Encounter for screening mammogram for malignant neoplasm of breast: Secondary | ICD-10-CM | POA: Diagnosis present

## 2021-05-11 LAB — SURGICAL PATHOLOGY

## 2021-05-15 ENCOUNTER — Inpatient Hospital Stay
Admission: RE | Admit: 2021-05-15 | Discharge: 2021-05-15 | Disposition: A | Discharge status: Home / Self Care | Payer: Self-pay | Source: Ambulatory Visit | Attending: *Deleted | Admitting: *Deleted

## 2021-05-15 ENCOUNTER — Other Ambulatory Visit: Payer: Self-pay | Admitting: *Deleted

## 2021-05-15 DIAGNOSIS — Z1231 Encounter for screening mammogram for malignant neoplasm of breast: Secondary | ICD-10-CM

## 2021-05-22 ENCOUNTER — Ambulatory Visit: Payer: No Typology Code available for payment source | Admitting: Family Medicine

## 2021-05-22 NOTE — Progress Notes (Deleted)
? ?  Michelle Shea,acting as a scribe for Gwyneth Sprout, FNP.,have documented all relevant documentation on the behalf of Gwyneth Sprout, FNP,as directed by  Gwyneth Sprout, FNP while in the presence of Gwyneth Sprout, FNP. ?  ? ? ?Established patient visit ? ? ?Patient: Michelle Shea   DOB: [birth year]   56 y.o. Female  MRN: [id number] ?Visit Date: 05/22/2021 ? ?Today's healthcare provider: Gwyneth Sprout, FNP  ? ?No chief complaint on file. ? ?Subjective  ?  ?HPI  ?Hypertension, follow-up ? ?BP Readings from Last 3 Encounters:  ?05/09/21 124/90  ?04/25/21 (!) 131/93  ?08/01/20 (!) 148/89  ? Wt Readings from Last 3 Encounters:  ?05/09/21 206 lb (93.4 kg)  ?04/25/21 206 lb (93.4 kg)  ?06/10/20 206 lb (93.4 kg)  ?  ? ?She was last seen for hypertension 1 months ago.  ?BP at that visit was as above. Management since that visit includes none. ? ?She reports {excellent/good/fair/poor:19665} compliance with treatment. ?She {is/is not:9024} having side effects. {document side effects if present:1} ?She is following a {diet:21022986} diet. ?She {is/is not:9024} exercising. ?She {does/does not:200015} smoke. ? ?Use of agents associated with hypertension: {bp agents assoc with hypertension:511::"none"}.  ? ?Outside blood pressures are {***enter patient reported home BP readings, or 'not being checked':1}. ?Symptoms: ?{Yes/No:20286} chest pain {Yes/No:20286} chest pressure  ?{Yes/No:20286} palpitations {Yes/No:20286} syncope  ?{Yes/No:20286} dyspnea {Yes/No:20286} orthopnea  ?{Yes/No:20286} paroxysmal nocturnal dyspnea {Yes/No:20286} lower extremity edema  ? ?Pertinent labs: ?Lab Results  ?Component Value Date  ? CHOL 194 10/02/2018  ? HDL 46 10/02/2018  ? LDLCALC 125 (H) 10/02/2018  ? TRIG 116 10/02/2018  ? CHOLHDL 4.2 10/02/2018  ? Lab Results  ?Component Value Date  ? NA 142 11/04/2018  ? K 3.8 11/04/2018  ? CREATININE 0.78 11/04/2018  ? GFRNONAA 87 11/04/2018  ? GLUCOSE 97 11/04/2018  ?  ? ?The 10-year ASCVD risk score  (Arnett DK, et al., 2019) is: 5.1%  ? ?--------------------------------------------------------------------------------------------------- ? ? ?Medications: ?Outpatient Medications Prior to Visit  ?Medication Sig  ? amLODipine (NORVASC) 10 MG tablet Take 1 tablet (10 mg total) by mouth daily.  ? ?No facility-administered medications prior to visit.  ? ? ?Review of Systems ? ?{Labs  Heme  Chem  Endocrine  Serology  Results Review (optional):23779} ?  Objective  ?  ?There were no vitals taken for this visit. ?{Show previous vital signs (optional):23777} ? ?Physical Exam  ?*** ? ?No results found for any visits on 05/22/21. ? Assessment & Plan  ?  ? ?*** ? ?No follow-ups on file.  ?   ? ?{provider attestation***:1} ? ? ?Gwyneth Sprout, FNP  ?[facility] ?[phone] (phone) ?[phone] (fax) ? ? [facility] ?

## 2021-06-08 ENCOUNTER — Encounter: Payer: Self-pay | Admitting: Gastroenterology

## 2021-06-09 ENCOUNTER — Ambulatory Visit
Admission: EM | Admit: 2021-06-09 | Discharge: 2021-06-09 | Disposition: A | Discharge status: Home / Self Care | Payer: No Typology Code available for payment source

## 2021-06-09 DIAGNOSIS — B349 Viral infection, unspecified: Secondary | ICD-10-CM | POA: Diagnosis not present

## 2021-06-09 NOTE — ED Triage Notes (Signed)
Pt was recently seen at the emergency room on 3/18. Pt states she need a doctors note to return to work . Pt has been without a fever for 24 hrs. Pt denies any more symptoms.  ?

## 2021-06-09 NOTE — ED Provider Notes (Signed)
?Michelle Shea ? ? ? ?CSN: [id number] ?Arrival date & time: 06/09/21  1354 ? ? ?  ? ?History   ?Chief Complaint ?Chief Complaint  ?Patient presents with  ? Follow-up  ?  Entered by patient  ? ? ?HPI ?Michelle Shea is a 56 y.o. female who presents requesting a note to return to work. She was in the ER 3/18 with vomiting and diagnosed with UTI. She feels back to normal now, and still has a few more antibiotic pills to finish for the UTI. Her boss told her to not return til Monday 27th.  ? ? ? ?Past Medical History:  ?Diagnosis Date  ? Anemia   ? Endometriosis   ? Hypertension   ? Ovarian cancer (Michelle Shea)   ? ? ?Patient Active Problem List  ? Diagnosis Date Noted  ? Abdominal fluid collection 09/15/2020  ? Ventral hernia without obstruction or gangrene 01/10/2017  ? History of anemia 11/03/2015  ? Ovarian cancer (Michelle Shea) 12/03/2014  ? Cancer of abdominal wall 07/07/2012  ? Malignant neoplasm of peritoneum (Michelle Shea) 07/07/2012  ? Constipation 06/11/2012  ? Other specified postprocedural states 05/28/2012  ? Stomach upset 05/28/2012  ? BP (high blood pressure) 05/19/2012  ? Endometriosis in cutaneous scar 11/08/2011  ? ? ?Past Surgical History:  ?Procedure Laterality Date  ? ABDOMINAL HYSTERECTOMY    ? 2014 TAH with bilateral BSO  ? COLONOSCOPY WITH PROPOFOL N/A 05/09/2021  ? Procedure: COLONOSCOPY WITH PROPOFOL;  Surgeon: Jonathon Bellows, MD;  Location: [facility] ENDOSCOPY;  Service: Gastroenterology;  Laterality: N/A;  ? ENDOMETRIAL BIOPSY    ? ? ?OB History   ?No obstetric history on file. ?  ? ? ? ?Home Medications   ? ?Prior to Admission medications   ?Medication Sig Start Date End Date Taking? Authorizing Provider  ?amLODipine (NORVASC) 10 MG tablet Take 1 tablet (10 mg total) by mouth daily. 06/22/20   Just, Laurita Quint, FNP  ? ? ?Family History ?Family History  ?Problem Relation Age of Onset  ? Breast cancer Mother 98  ? Breast cancer Maternal Aunt 75  ? ? ?Social History ?Social History  ? ?Tobacco Use  ? Smoking status: Never   ? Smokeless tobacco: Never  ?Vaping Use  ? Vaping Use: Never used  ?Substance Use Topics  ? Alcohol use: Yes  ?  Alcohol/week: 0.0 standard drinks  ?  Comment: sometimes  ? Drug use: No  ? ? ? ?Allergies   ?Patient has no known allergies. ? ? ?Review of Systems ?Review of Systems  ?All other systems reviewed and are negative. ? ? ?Physical Exam ?Triage Vital Signs ?ED Triage Vitals  ?Enc Vitals Group  ?   BP 06/09/21 1434 (!) 123/94  ?   Pulse Rate 06/09/21 1434 84  ?   Resp 06/09/21 1434 16  ?   Temp 06/09/21 1434 98.6 ?F (37 ?C)  ?   Temp Source 06/09/21 1434 Oral  ?   SpO2 06/09/21 1434 99 %  ?   Weight --   ?   Height --   ?   Head Circumference --   ?   Peak Flow --   ?   Pain Score 06/09/21 1433 0  ?   Pain Loc --   ?   Pain Edu? --   ?   Excl. in [place]? --   ? ?No data found. ? ?Updated Vital Signs ?BP (!) 123/94 (BP Location: Left Arm)   Pulse 84   Temp 98.6 ?F (37 ?  C) (Oral)   Resp 16   SpO2 99%  ? ?Visual Acuity ?Right Eye Distance:   ?Left Eye Distance:   ?Bilateral Distance:   ? ?Right Eye Near:   ?Left Eye Near:    ?Bilateral Near:    ? ?Physical Exam ?Vitals and nursing note reviewed.  ?Constitutional:   ?   General: She is not in acute distress. ?   Appearance: She is obese. She is not ill-appearing, toxic-appearing or diaphoretic.  ?HENT:  ?   Right Ear: External ear normal.  ?   Left Ear: External ear normal.  ?Eyes:  ?   General: No scleral icterus. ?   Conjunctiva/sclera: Conjunctivae normal.  ?Pulmonary:  ?   Effort: Pulmonary effort is normal.  ?   Breath sounds: Normal breath sounds.  ?Abdominal:  ?   General: Bowel sounds are normal.  ?   Palpations: Abdomen is soft. There is no mass.  ?   Tenderness: There is no abdominal tenderness.  ?Musculoskeletal:     ?   General: Normal range of motion.  ?   Cervical back: Neck supple.  ?Skin: ?   General: Skin is warm and dry.  ?   Findings: No rash.  ?Neurological:  ?   Mental Status: She is alert and oriented to person, place, and time.  ?   Gait:  Gait normal.  ?Psychiatric:     ?   Mood and Affect: Mood normal.     ?   Behavior: Behavior normal.     ?   Thought Content: Thought content normal.     ?   Judgment: Judgment normal.  ? ? ? ?UC Treatments / Results  ?Labs ?(all labs ordered are listed, but only abnormal results are displayed) ?Labs Reviewed - No data to display ? ?EKG ? ? ?Radiology ?No results found. ? ?Procedures ?Procedures (including critical care time) ? ?Medications Ordered in UC ?Medications - No data to display ? ?Initial Impression / Assessment and Plan / UC Course  ?I have reviewed the triage vital signs and the nursing notes. ?Resolved vomiting ?UTI under treatment ?She was given work note to return on 3/27 ?Final Clinical Impressions(s) / UC Diagnoses  ? ?Final diagnoses:  ?Nonspecific syndrome suggestive of viral illness  ? ?Discharge Instructions   ?None ?  ? ?ED Prescriptions   ?None ?  ? ?PDMP not reviewed this encounter. ?  ?Shelby Mattocks, PA-C ?06/09/21 1449 ? ?

## 2021-06-23 ENCOUNTER — Ambulatory Visit: Payer: No Typology Code available for payment source | Admitting: Physician Assistant

## 2021-06-23 ENCOUNTER — Encounter: Payer: Self-pay | Admitting: Physician Assistant

## 2021-06-23 VITALS — BP 126/89 | HR 79 | Resp 16 | Wt 209.0 lb

## 2021-06-23 DIAGNOSIS — R350 Frequency of micturition: Secondary | ICD-10-CM | POA: Diagnosis not present

## 2021-06-23 DIAGNOSIS — N390 Urinary tract infection, site not specified: Secondary | ICD-10-CM

## 2021-06-23 DIAGNOSIS — R319 Hematuria, unspecified: Secondary | ICD-10-CM | POA: Diagnosis not present

## 2021-06-23 LAB — POCT URINALYSIS DIPSTICK
Bilirubin, UA: NEGATIVE
Blood, UA: POSITIVE
Glucose, UA: NEGATIVE
Ketones, UA: NEGATIVE
Nitrite, UA: POSITIVE
Protein, UA: POSITIVE — AB
Spec Grav, UA: 1.025 (ref 1.010–1.025)
Urobilinogen, UA: 0.2 E.U./dL
pH, UA: 6 (ref 5.0–8.0)

## 2021-06-23 NOTE — Patient Instructions (Signed)
Based on your symptoms and the results of your urinalysis I think you may potentially have another UTI but I would like to confirm this with a urine culture first since your were just treated for this so recently ? ?The urine culture will take a few days to return and we will keep you updated with those results and if another antibiotic will be needed to treat your current symptoms ?In the meantime, please stay well hydrated with plenty of water (at least 75 oz per day) and try not to hold your urine. ? ?If you notice the following please go to the ED: fever, flank pain, inability to urinate, low blood pressure, confusion, blood in your urine, severe pain with urination  ? ?It was nice to meet you and I appreciate the opportunity to be involved in your care ? ?

## 2021-06-23 NOTE — Progress Notes (Signed)
?  ? ? ?Acute Office visit ? ?I,[name],acting as a Education administrator for [employer], PA-C.,have documented all relevant documentation on the behalf of [place], PA-C,as directed by  [employer], PA-C while in the presence of [name], PA-C. ? ? ?Patient: Michelle Shea   DOB: 04-28-1965   56 y.o. Female  MRN: [id number] ?Visit Date: 06/23/2021 ? ?Today's healthcare provider: Dani Gobble Noble Cicalese, PA-C  ?Introduced myself to the patient as a Journalist, newspaper and provided education on APPs in clinical practice.  ? ?Chief Complaint  ?Patient presents with  ? Urinary Tract Infection  ? ?Subjective  ?  ?HPI  ? ?Patient was seen in ED 06/09/2021 for concern for UTI. Patient had UA which was + for nitrites, protein and blood. Urine culture resulted on 06-07-2021 demonstrated E.coli that was susceptible to [place].  Patient was given Macrobid 100 mg PO BID x 7 days , which she completed.  ? ?She states she finished her antibiotic that was rxd 06-03-2021  ?She states she is still having urinary frequency and reports an odd "tingling" sensation when she finishes urinating ?Denies dysuria, vaginal discomfort, or urine odor ?Reports she has had increased frequency since finishing her antibiotic ?Has to wake up and urinate roughly 5 times per night ? ?She reports since she has been sick she has been drinking more liquids than normal  ? ? ?Medications: ?Outpatient Medications Prior to Visit  ?Medication Sig  ? amLODipine (NORVASC) 10 MG tablet Take 1 tablet (10 mg total) by mouth daily.  ? ?No facility-administered medications prior to visit.  ? ? ?Review of Systems  ?Constitutional:  Negative for chills, diaphoresis, fatigue and fever.  ?Gastrointestinal:  Negative for abdominal pain.  ?Genitourinary:  Positive for frequency and urgency. Negative for difficulty urinating, dysuria, enuresis, flank pain, hematuria and vaginal pain.  ?Musculoskeletal:  Negative for back pain.  ?Neurological:  Negative for dizziness, light-headedness and headaches.   ? ? ?  Objective  ?  ?BP 126/89 (BP Location: Right Arm, Patient Position: Sitting, Cuff Size: Large)   Pulse 79   Resp 16   Wt 209 lb (94.8 kg)   SpO2 97%   BMI 35.87 kg/m?  ? ? ?Physical Exam ?Vitals reviewed.  ?Constitutional:   ?   General: She is awake.  ?   Appearance: Normal appearance. She is well-developed and well-groomed. She is obese.  ?HENT:  ?   Head: Normocephalic and atraumatic.  ?Cardiovascular:  ?   Rate and Rhythm: Normal rate and regular rhythm.  ?   Pulses: Normal pulses.  ?   Heart sounds: Normal heart sounds.  ?Pulmonary:  ?   Effort: Pulmonary effort is normal.  ?   Breath sounds: Normal breath sounds. No decreased breath sounds, wheezing, rhonchi or rales.  ?Abdominal:  ?   General: Abdomen is flat. Bowel sounds are normal.  ?   Palpations: Abdomen is soft.  ?   Tenderness: There is no abdominal tenderness. There is no right CVA tenderness or left CVA tenderness.  ?Musculoskeletal:  ?   Right lower leg: No edema.  ?   Left lower leg: No edema.  ?Neurological:  ?   Mental Status: She is alert.  ?Psychiatric:     ?   Attention and Perception: Attention and perception normal.     ?   Mood and Affect: Mood and affect normal.     ?   Speech: Speech normal.     ?  Behavior: Behavior normal. Behavior is cooperative.  ?  ? Latest Reference Range & Units 06/23/21 16:04  ?Bilirubin, UA  Negative  ?Clarity, UA  Cloudy  ?Color, UA  Yellow  ?Glucose Negative  Negative  ?Ketones, UA  Negative  ?Leukocytes,UA Negative  Large (3+) !  ?Nitrite, UA  Positive  ?pH, UA 5.0 - 8.0  6.0  ?Protein,UA Negative  Positive !  ?Specific Gravity, UA 1.010 - 1.025  1.025  ?Urobilinogen, UA 0.2 or 1.0 E.U./dL 0.2  ?RBC, UA  Positive  ?!: Data is abnormal ? ?No results found for any visits on 06/23/21. ? Assessment & Plan  ?  ? ?Problem List Items Addressed This Visit   ?None ?Visit Diagnoses   ? ? Increased urinary frequency    -  Primary ?Acute, recurrent concern ?Patient is concerned for unresolved or recurrent  UTI from March ?UA performed in office revealed + leukocytes, nitrites, protein and RBCs  ?Will send for urine culture to assess for bacterial growth and susceptibility testing ?  ? Relevant Orders  ? POCT urinalysis dipstick (Completed)  ? CULTURE, URINE COMPREHENSIVE  ? Urinary tract infection with hematuria, site unspecified     ?Acute, recurrent  ?Patient is concerned for unresolved UTI from March ?Reviewed encounter summary  from her visit to [facility] ED on 06/03/2021 which had positive UA and culture results  ?She was treated with Macrobid 100 mg PO BID x 7 days and completed course but is still having urinary frequency and tingling sensation at the end of urination  ?UA + for leukocytes, RBCs, nitrites, and protein ?Will send for urine culture for organism isolation and susceptibility results ?Recommend waiting for culture results before initiating another round of abx since she just completed Macrobid course ?Results of culture to dictate further management  ?Provided patient with ED precautions ?Recommend she stay well hydrated and avoid holding urine  ?Follow up as needed for lingering or worsening symptoms  ? Relevant Orders  ? CULTURE, URINE COMPREHENSIVE  ? ?  ? ? ? ?No follow-ups on file. ? ? ?I, Dan Dissinger E Kolbi Tofte, PA-C, have reviewed all documentation for this visit. The documentation on 06/23/21 for the exam, diagnosis, procedures, and orders are all accurate and complete. ? ? ?[place] Dingley, Glennie [place] MPH ?[place] ?[facility] ? ? ? ?

## 2021-06-28 MED ORDER — SULFAMETHOXAZOLE-TRIMETHOPRIM 800-160 MG PO TABS: 1.0000 | ORAL_TABLET | Freq: Two times a day (BID) | ORAL | 0 refills | Status: AC

## 2021-06-28 NOTE — Addendum Note (Signed)
Addended by: Talitha Givens on: 06/28/2021 09:03 AM ? ? Modules accepted: Orders ? ?

## 2021-07-01 LAB — CULTURE, URINE COMPREHENSIVE

## 2021-09-01 ENCOUNTER — Other Ambulatory Visit: Payer: Self-pay | Admitting: Family Medicine

## 2021-09-01 DIAGNOSIS — I10 Essential (primary) hypertension: Secondary | ICD-10-CM

## 2021-09-01 MED ORDER — AMLODIPINE BESYLATE 10 MG PO TABS: 10.0000 mg | ORAL_TABLET | Freq: Every day | ORAL | 1 refills | Status: DC

## 2021-09-01 NOTE — Telephone Encounter (Signed)
Requested Prescriptions  Pending Prescriptions Disp Refills  . amLODipine (NORVASC) 10 MG tablet 90 tablet 1    Sig: Take 1 tablet (10 mg total) by mouth daily.     Cardiovascular: Calcium Channel Blockers 2 Passed - 09/01/2021  4:06 PM      Passed - Last BP in normal range    BP Readings from Last 1 Encounters:  06/23/21 126/89         Passed - Last Heart Rate in normal range    Pulse Readings from Last 1 Encounters:  06/23/21 79         Passed - Valid encounter within last 6 months    Recent Outpatient Visits          2 months ago Increased urinary frequency   [place], PA-C   4 months ago Shoulder stiffness, right   [employer], Dani Gobble, PA-C   1 year ago Urine malodor   [place], Vickki Muff, PA-C   1 year ago History of anemia   [employer] Just, Laurita Quint, FNP   1 year ago Essential hypertension   [employer], Clearnce Sorrel, PA-C      Future Appointments            In 4 days Gwyneth Sprout, [place], [facility]

## 2021-09-01 NOTE — Telephone Encounter (Signed)
Medication Refill - Medication: amLODipine (NORVASC) 10 MG tablet [[id number]  Has the patient contacted their pharmacy? Yes.     Preferred Pharmacy (with phone number or street name):  [facility] ([facility] [place] [address]  [place] ([place]) [place] [address]  Phone: [phone] Fax: [phone]  Hours: Not open 24 hours      Has the patient been seen for an appointment in the last year OR does the patient have an upcoming appointment? Yes.    Agent: Please be advised that RX refills may take up to 3 business days. We ask that you follow-up with your pharmacy.

## 2021-09-05 ENCOUNTER — Telehealth: Payer: No Typology Code available for payment source | Admitting: Family Medicine

## 2021-09-05 NOTE — Progress Notes (Deleted)
    MyChart Video Visit    Virtual Visit via Video Note   This visit type was conducted due to national recommendations for restrictions regarding the COVID-19 Pandemic (e.g. social distancing) in an effort to limit this patient's exposure and mitigate transmission in our community. This patient is at least at moderate risk for complications without adequate follow up. This format is felt to be most appropriate for this patient at this time. Physical exam was limited by quality of the video and audio technology used for the visit.   Patient location: *** Provider location: ***  I discussed the limitations of evaluation and management by telemedicine and the availability of in person appointments. The patient expressed understanding and agreed to proceed.  Patient: Michelle Shea   DOB: 1965-07-07   57 y.o. Female  MRN: [id number] Visit Date: 09/05/2021  Today's healthcare provider: Gwyneth Sprout, FNP   No chief complaint on file.  Subjective    HPI   ---------------------------------------------------------------------------------------------------    Medications: Outpatient Medications Prior to Visit  Medication Sig   amLODipine (NORVASC) 10 MG tablet Take 1 tablet (10 mg total) by mouth daily.   No facility-administered medications prior to visit.    Review of Systems  {Labs  Heme  Chem  Endocrine  Serology  Results Review (optional):23779}   Objective    There were no vitals taken for this visit.  {Show previous vital signs (optional):23777}   Physical Exam     Assessment & Plan     ***  No follow-ups on file.     I discussed the assessment and treatment plan with the patient. The patient was provided an opportunity to ask questions and all were answered. The patient agreed with the plan and demonstrated an understanding of the instructions.   The patient was advised to call back or seek an in-person evaluation if the symptoms worsen or if the  condition fails to improve as anticipated.  I provided *** minutes of non-face-to-face time during this encounter.  {provider attestation***:1}  Gwyneth Sprout, [place] [phone] (phone) [phone] (fax)  [place]

## 2022-01-18 ENCOUNTER — Encounter: Payer: Self-pay | Admitting: Family Medicine

## 2022-01-18 ENCOUNTER — Ambulatory Visit: Payer: No Typology Code available for payment source | Admitting: Family Medicine

## 2022-01-18 VITALS — BP 125/87 | HR 87 | Resp 16 | Ht 64.0 in | Wt 213.0 lb

## 2022-01-18 DIAGNOSIS — Z1329 Encounter for screening for other suspected endocrine disorder: Secondary | ICD-10-CM | POA: Diagnosis not present

## 2022-01-18 DIAGNOSIS — Z862 Personal history of diseases of the blood and blood-forming organs and certain disorders involving the immune mechanism: Secondary | ICD-10-CM

## 2022-01-18 DIAGNOSIS — E7800 Pure hypercholesterolemia, unspecified: Secondary | ICD-10-CM

## 2022-01-18 DIAGNOSIS — I10 Essential (primary) hypertension: Secondary | ICD-10-CM

## 2022-01-18 DIAGNOSIS — Z131 Encounter for screening for diabetes mellitus: Secondary | ICD-10-CM | POA: Diagnosis not present

## 2022-01-18 DIAGNOSIS — R252 Cramp and spasm: Secondary | ICD-10-CM | POA: Diagnosis not present

## 2022-01-18 DIAGNOSIS — Z1321 Encounter for screening for nutritional disorder: Secondary | ICD-10-CM | POA: Insufficient documentation

## 2022-01-18 NOTE — Assessment & Plan Note (Signed)
Historical; with complaints of 3 day course of bilateral calf ache/cramping relieved by stopping activity Check CBC

## 2022-01-18 NOTE — Progress Notes (Signed)
Established patient visit   Patient: Michelle Shea   DOB: [birth year]   56 y.o. Female  MRN: [id number] Visit Date: 01/18/2022  Today's healthcare provider: Gwyneth Sprout, FNP  Patient presents for new patient visit to establish care.  Introduced to Designer, jewellery role and practice setting.  All questions answered.  Discussed provider/patient relationship and expectations.  I,Tiffany J Bragg,acting as a scribe for Gwyneth Sprout, FNP.,have documented all relevant documentation on the behalf of Gwyneth Sprout, FNP,as directed by  Gwyneth Sprout, FNP while in the presence of Gwyneth Sprout, FNP.   Chief Complaint  Patient presents with   Muscle Pain    Patient complains of calves cramping starting 3 days ago. States she usually doesn't drink water, primarily drinks sweet tea.    Subjective    HPI HPI     Muscle Pain    Additional comments: Patient complains of calves cramping starting 3 days ago. States she usually doesn't drink water, primarily drinks sweet tea.       Last edited by Smitty Knudsen, CMA on 01/18/2022 10:48 AM.      Medications: Outpatient Medications Prior to Visit  Medication Sig   amLODipine (NORVASC) 10 MG tablet Take 1 tablet (10 mg total) by mouth daily.   No facility-administered medications prior to visit.    Review of Systems    Objective    BP 125/87 (BP Location: Left Arm, Patient Position: Sitting, Cuff Size: Normal)   Pulse 87   Resp 16   Ht '5\' 4"'$  (1.626 m)   Wt 213 lb (96.6 kg)   SpO2 99%   BMI 36.56 kg/m   Physical Exam Vitals and nursing note reviewed.  Constitutional:      General: She is not in acute distress.    Appearance: Normal appearance. She is obese. She is not ill-appearing, toxic-appearing or diaphoretic.  HENT:     Head: Normocephalic and atraumatic.  Cardiovascular:     Rate and Rhythm: Normal rate and regular rhythm.     Pulses: Normal pulses.     Heart sounds: Normal heart sounds. No murmur heard.     No friction rub. No gallop.  Pulmonary:     Effort: Pulmonary effort is normal. No respiratory distress.     Breath sounds: Normal breath sounds. No stridor. No wheezing, rhonchi or rales.  Chest:     Chest wall: No tenderness.  Musculoskeletal:        General: No swelling, tenderness, deformity or signs of injury. Normal range of motion.     Right lower leg: Normal. No swelling, deformity, lacerations, tenderness or bony tenderness. No edema.     Left lower leg: Normal. No swelling, deformity, lacerations, tenderness or bony tenderness. No edema.     Comments: Denies low back pain or concern for sciatica   Skin:    General: Skin is warm and dry.     Capillary Refill: Capillary refill takes less than 2 seconds.     Coloration: Skin is not jaundiced or pale.     Findings: No bruising, erythema, lesion or rash.     Comments: No calf edema, swelling, tenderness or unilateral pain Denies worsening symptoms with foot dorsiflexion   Neurological:     General: No focal deficit present.     Mental Status: She is alert and oriented to person, place, and time. Mental status is at baseline.     Cranial Nerves: No cranial nerve deficit.  Sensory: No sensory deficit.     Motor: No weakness.     Coordination: Coordination normal.  Psychiatric:        Mood and Affect: Mood normal.        Behavior: Behavior normal.        Thought Content: Thought content normal.        Judgment: Judgment normal.     No results found for any visits on 01/18/22.  Assessment & Plan     Problem List Items Addressed This Visit       Cardiovascular and Mediastinum   BP (high blood pressure)    Chronic, stable On Norvasc 10 mg Fill history shows poor compliance Repeat CBC and CMP        Other   Elevated LDL cholesterol level    Chronic, previously elevated Controlled only with diet and exercise Not on statin Repeat LP Goal <100 LDL      Relevant Orders   Lipid panel   Encounter for vitamin  deficiency screening    Check Vit B6 ,B12/folate, Vit D with muscular complaints       Relevant Orders   B12 and Folate Panel   Vitamin B6   Vitamin D (25 hydroxy)   History of anemia    Historical; with complaints of 3 day course of bilateral calf ache/cramping relieved by stopping activity Check CBC       Relevant Orders   CBC with Differential/Platelet   Leg cramping - Primary    Acute, continuous X3 days Reports poor PO intake of water; much prefers sweet tea Recommend CMP as well as Mg and vitamin screening No concern for DVT given no erythema, no calf tenderness, no edema, bilateral legs are soft and warm without injury  Denies long travel and is active at job       Relevant Orders   Comprehensive Metabolic Panel (CMET)   Magnesium   B12 and Folate Panel   Vitamin B6   Vitamin D (25 hydroxy)   Screening for diabetes mellitus    Recommend diabetic screening given obesity and htn with previous elevated LDL      Relevant Orders   Hemoglobin A1c   Screening for thyroid disorder    Recommend thyroid screening given continuous cramping for 3 days       Relevant Orders   TSH + free T4   Return in about 6 months (around 07/19/2022), or if symptoms worsen or fail to improve, for annual examination.     Vonna Kotyk, FNP, have reviewed all documentation for this visit. The documentation on 01/18/22 for the exam, diagnosis, procedures, and orders are all accurate and complete.  Gwyneth Sprout, [place] [phone] (phone) [phone] (fax)  [place]

## 2022-01-18 NOTE — Assessment & Plan Note (Signed)
Chronic, previously elevated Controlled only with diet and exercise Not on statin Repeat LP Goal <100 LDL

## 2022-01-18 NOTE — Assessment & Plan Note (Signed)
Recommend diabetic screening given obesity and htn with previous elevated LDL

## 2022-01-18 NOTE — Assessment & Plan Note (Signed)
Chronic, stable On Norvasc 10 mg Fill history shows poor compliance Repeat CBC and CMP

## 2022-01-18 NOTE — Assessment & Plan Note (Signed)
Acute, continuous X3 days Reports poor PO intake of water; much prefers sweet tea Recommend CMP as well as Mg and vitamin screening No concern for DVT given no erythema, no calf tenderness, no edema, bilateral legs are soft and warm without injury  Denies long travel and is active at job

## 2022-01-18 NOTE — Assessment & Plan Note (Signed)
Check Vit B6 ,B12/folate, Vit D with muscular complaints

## 2022-01-18 NOTE — Assessment & Plan Note (Signed)
Recommend thyroid screening given continuous cramping for 3 days

## 2022-01-19 NOTE — Progress Notes (Signed)
Pre-diabetes noted in 3 month blood sugar average. Continue to recommend balanced, lower carb meals. Smaller meal size, adding snacks. Choosing water as drink of choice and increasing purposeful exercise.  Recommend high dose once weekly Vit D supplement; take with fat containing meal. Vit D is 5; normal is 30-100.  Liver enzymes are all elevated; recommend lower fat diet and repeat in 3-6 months. If elevation remains, recommend referral to GI for liver scan and possible biopsy to rule out liver disease/cirrhosis.   LDL/bad cholesterol is slightly elevated; estimated stroke/heart attack risk is 6% in 10 years. I continue to recommend diet low in saturated fat and regular exercise - 30 min at least 5 times per week. Can start cholesterol medication to assist if desired. The 10-year ASCVD risk score (Arnett DK, et al., 2019) is: 5.5%   Values used to calculate the score:     Age: 55 years     Sex: Female     Is Non-Hispanic African American: Yes     Diabetic: No     Tobacco smoker: No     Systolic Blood Pressure: 038 mmHg     Is BP treated: Yes     HDL Cholesterol: 46 mg/dL     Total Cholesterol: 191 mg/dL  B12 is also very low; recommend OTC supplement vs in office injections to assist.  B6 pending.  Michelle Shea, [address] [place], [place] [address] [phone] (phone) [phone] (fax) [place]

## 2022-01-22 LAB — COMPREHENSIVE METABOLIC PANEL
ALT: 33 IU/L — ABNORMAL HIGH (ref 0–32)
AST: 124 IU/L — ABNORMAL HIGH (ref 0–40)
Albumin/Globulin Ratio: 1.4 (ref 1.2–2.2)
Albumin: 3.9 g/dL (ref 3.8–4.9)
Alkaline Phosphatase: 142 IU/L — ABNORMAL HIGH (ref 44–121)
BUN/Creatinine Ratio: 12 (ref 9–23)
BUN: 11 mg/dL (ref 6–24)
Bilirubin Total: 0.3 mg/dL (ref 0.0–1.2)
CO2: 24 mmol/L (ref 20–29)
Calcium: 9 mg/dL (ref 8.7–10.2)
Chloride: 106 mmol/L (ref 96–106)
Creatinine, Ser: 0.9 mg/dL (ref 0.57–1.00)
Globulin, Total: 2.8 g/dL (ref 1.5–4.5)
Glucose: 89 mg/dL (ref 70–99)
Potassium: 3.6 mmol/L (ref 3.5–5.2)
Sodium: 142 mmol/L (ref 134–144)
Total Protein: 6.7 g/dL (ref 6.0–8.5)
eGFR: 75 mL/min/{1.73_m2} (ref >59)

## 2022-01-22 LAB — CBC WITH DIFFERENTIAL/PLATELET
Basophils Absolute: 0.1 10*3/uL (ref 0.0–0.2)
Basos: 1 %
EOS (ABSOLUTE): 0.2 10*3/uL (ref 0.0–0.4)
Eos: 3 %
Hematocrit: 36.8 % (ref 34.0–46.6)
Hemoglobin: 12.5 g/dL (ref 11.1–15.9)
Immature Grans (Abs): 0 10*3/uL (ref 0.0–0.1)
Immature Granulocytes: 0 %
Lymphocytes Absolute: 2.4 10*3/uL (ref 0.7–3.1)
Lymphs: 30 %
MCH: 29.8 pg (ref 26.6–33.0)
MCHC: 34 g/dL (ref 31.5–35.7)
MCV: 88 fL (ref 79–97)
Monocytes Absolute: 0.6 10*3/uL (ref 0.1–0.9)
Monocytes: 8 %
Neutrophils Absolute: 4.6 10*3/uL (ref 1.4–7.0)
Neutrophils: 58 %
Platelets: 239 10*3/uL (ref 150–450)
RBC: 4.2 x10E6/uL (ref 3.77–5.28)
RDW: 13.8 % (ref 11.7–15.4)
WBC: 7.8 10*3/uL (ref 3.4–10.8)

## 2022-01-22 LAB — LIPID PANEL
Chol/HDL Ratio: 4.2 ratio (ref 0.0–4.4)
Cholesterol, Total: 191 mg/dL (ref 100–199)
HDL: 46 mg/dL (ref >39)
LDL Chol Calc (NIH): 121 mg/dL — ABNORMAL HIGH (ref 0–99)
Triglycerides: 137 mg/dL (ref 0–149)
VLDL Cholesterol Cal: 24 mg/dL (ref 5–40)

## 2022-01-22 LAB — TSH+FREE T4
Free T4: 1.14 ng/dL (ref 0.82–1.77)
TSH: 2.35 u[IU]/mL (ref 0.450–4.500)

## 2022-01-22 LAB — VITAMIN D 25 HYDROXY (VIT D DEFICIENCY, FRACTURES): Vit D, 25-Hydroxy: 5.2 ng/mL — ABNORMAL LOW (ref 30.0–100.0)

## 2022-01-22 LAB — MAGNESIUM: Magnesium: 1.7 mg/dL (ref 1.6–2.3)

## 2022-01-22 LAB — B12 AND FOLATE PANEL
Folate: 7.1 ng/mL (ref >3.0)
Vitamin B-12: 179 pg/mL — ABNORMAL LOW (ref 232–1245)

## 2022-01-22 LAB — HEMOGLOBIN A1C
Est. average glucose Bld gHb Est-mCnc: 123 mg/dL
Hgb A1c MFr Bld: 5.9 % — ABNORMAL HIGH (ref 4.8–5.6)

## 2022-01-22 LAB — VITAMIN B6: Vitamin B6: 13.5 ug/L (ref 3.4–65.2)

## 2022-02-27 ENCOUNTER — Telehealth: Payer: Self-pay | Admitting: Family Medicine

## 2022-02-27 NOTE — Telephone Encounter (Signed)
Referral Request - Has patient seen PCP for this complaint? no *If NO, is insurance requiring patient see PCP for this issue before PCP can refer them? Referral for which specialty: mammogram Preferred provider/office: ? Reason for referral: feels tingly in her right breast

## 2022-03-01 ENCOUNTER — Other Ambulatory Visit: Payer: Self-pay | Admitting: Family Medicine

## 2022-03-01 ENCOUNTER — Encounter: Payer: Self-pay | Admitting: Family Medicine

## 2022-03-01 DIAGNOSIS — N644 Mastodynia: Secondary | ICD-10-CM

## 2022-03-23 ENCOUNTER — Ambulatory Visit
Admission: RE | Admit: 2022-03-23 | Discharge: 2022-03-23 | Disposition: A | Discharge status: Home / Self Care | Payer: No Typology Code available for payment source | Source: Ambulatory Visit | Attending: Family Medicine | Admitting: Family Medicine

## 2022-03-23 ENCOUNTER — Ambulatory Visit
Admission: RE | Admit: 2022-03-23 | Discharge: 2022-03-23 | Disposition: A | Discharge status: Home / Self Care | Payer: No Typology Code available for payment source | Source: Ambulatory Visit | Attending: Family Medicine

## 2022-03-23 DIAGNOSIS — N644 Mastodynia: Secondary | ICD-10-CM | POA: Insufficient documentation

## 2022-03-23 NOTE — Progress Notes (Signed)
Hi M[place]  Normal mammogram; repeat in 1 year.  Please let us know if you have any questions.  Thank you,  Tally Joe, FNP

## 2022-06-30 IMAGING — MG MM DIGITAL SCREENING BILAT W/ TOMO AND CAD
8 series · 8 of 24 positions shown · non-contrast
Comparison: Previous exam(s).

CLINICAL DATA: Screening.

EXAM:
DIGITAL SCREENING BILATERAL MAMMOGRAM WITH TOMOSYNTHESIS AND CAD
TECHNIQUE: Bilateral screening digital craniocaudal and mediolateral oblique
mammograms were obtained. Bilateral screening digital breast
tomosynthesis was performed. The images were evaluated with
computer-aided detection.

[R CC synth-2D]
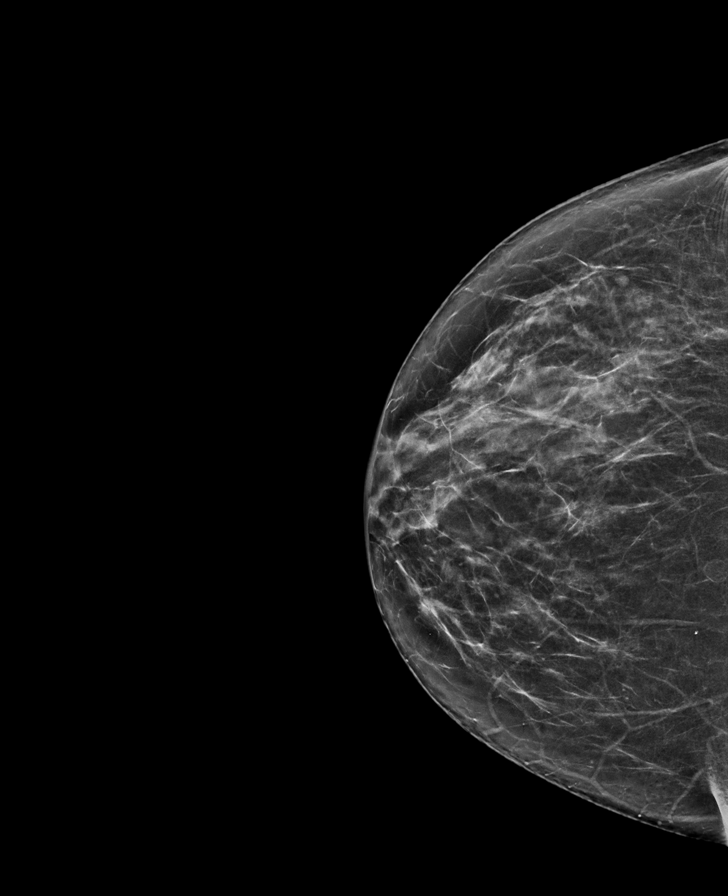

[R MLO synth-2D]
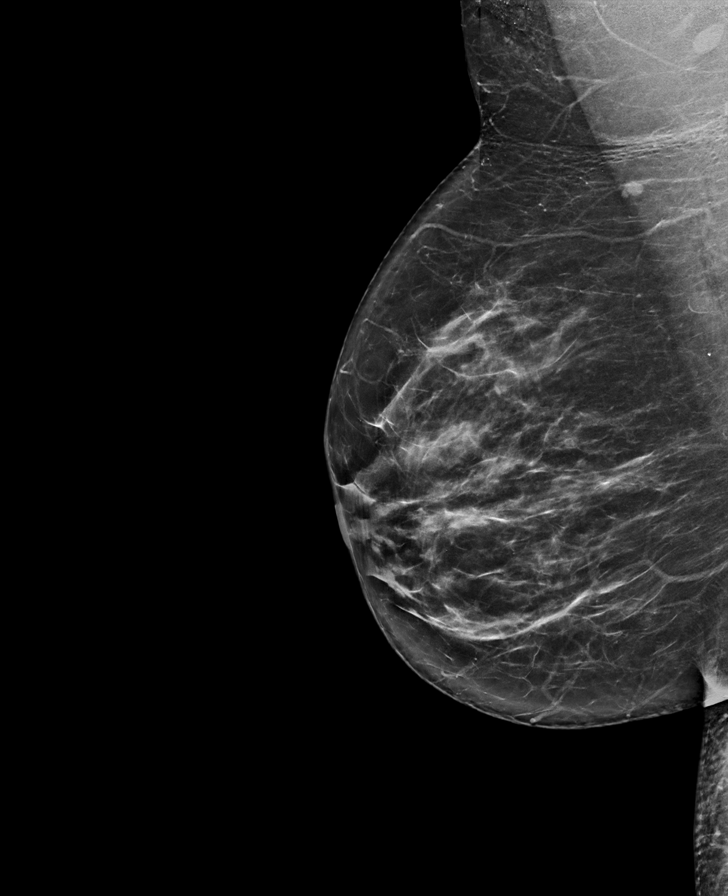

[L CC synth-2D]
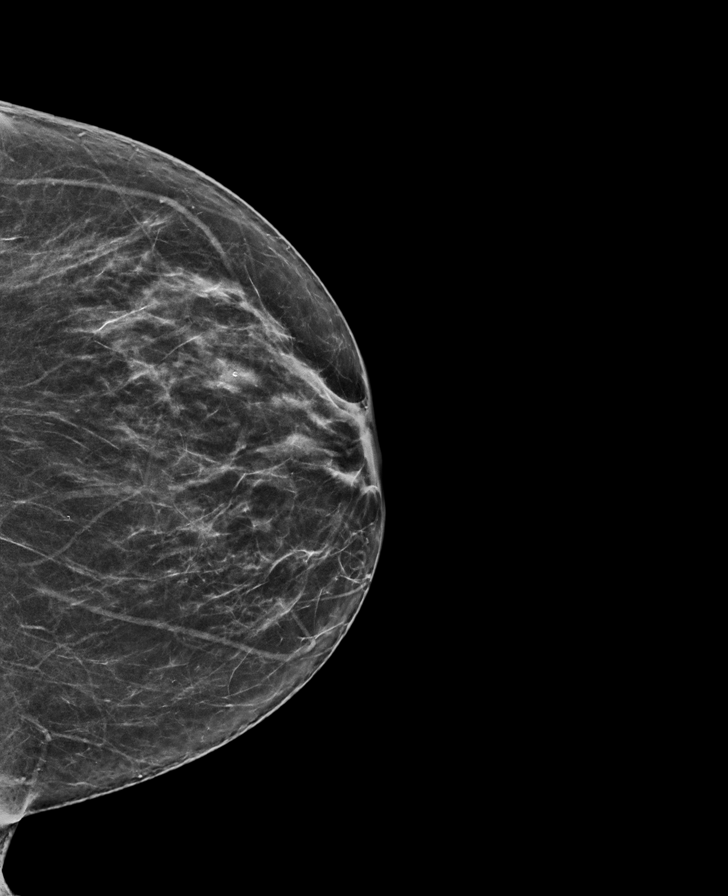

[L MLO synth-2D]
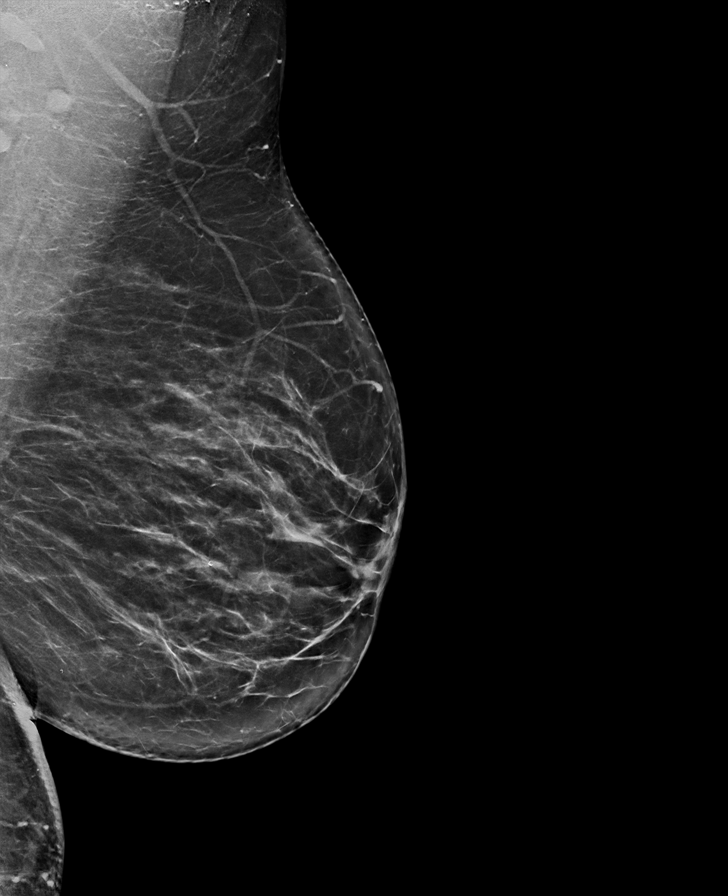

[L MLO tomo · tomo slice 43/84.0]
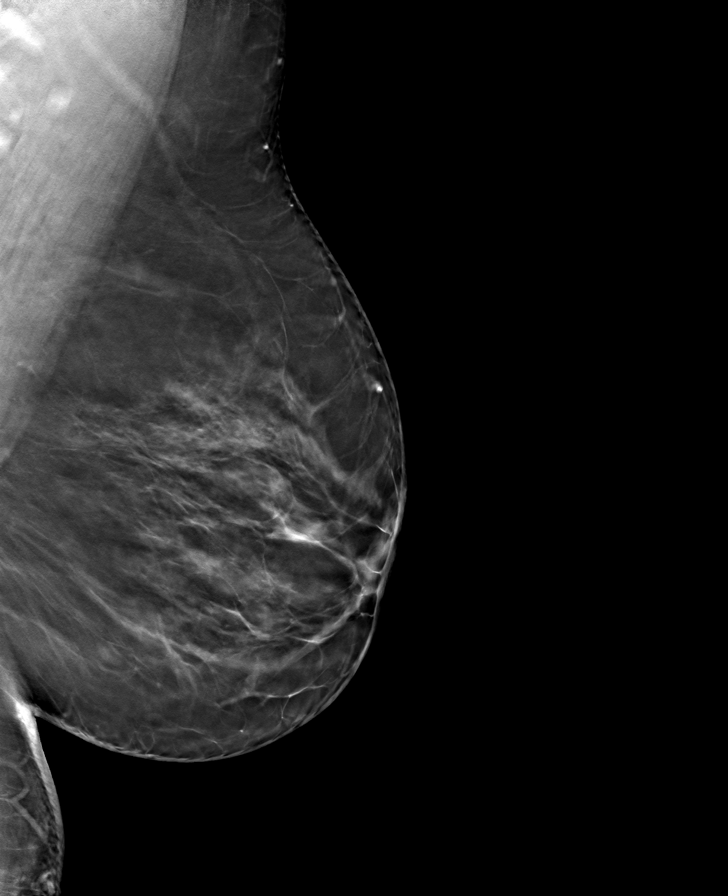

[R MLO tomo · tomo slice 39/78.0]
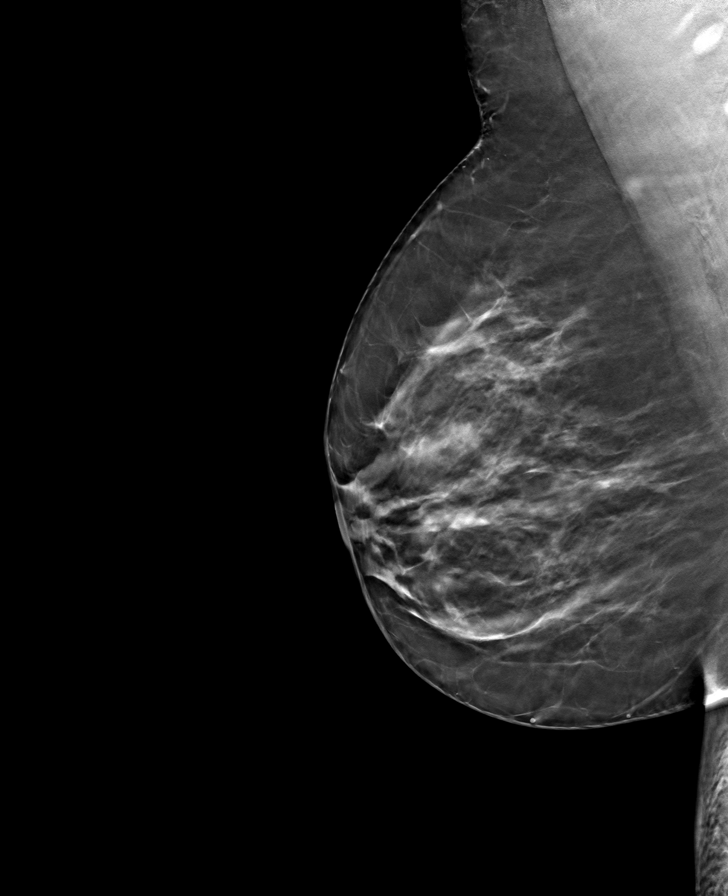

[L CC tomo · tomo slice 35/70.0]
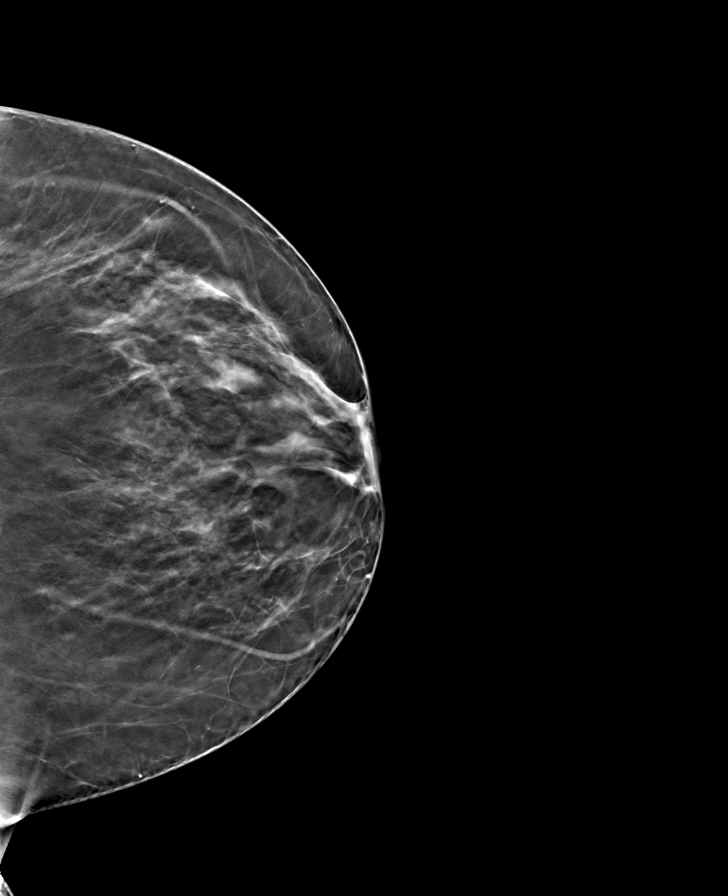

[R CC tomo · tomo slice 37/72.0]
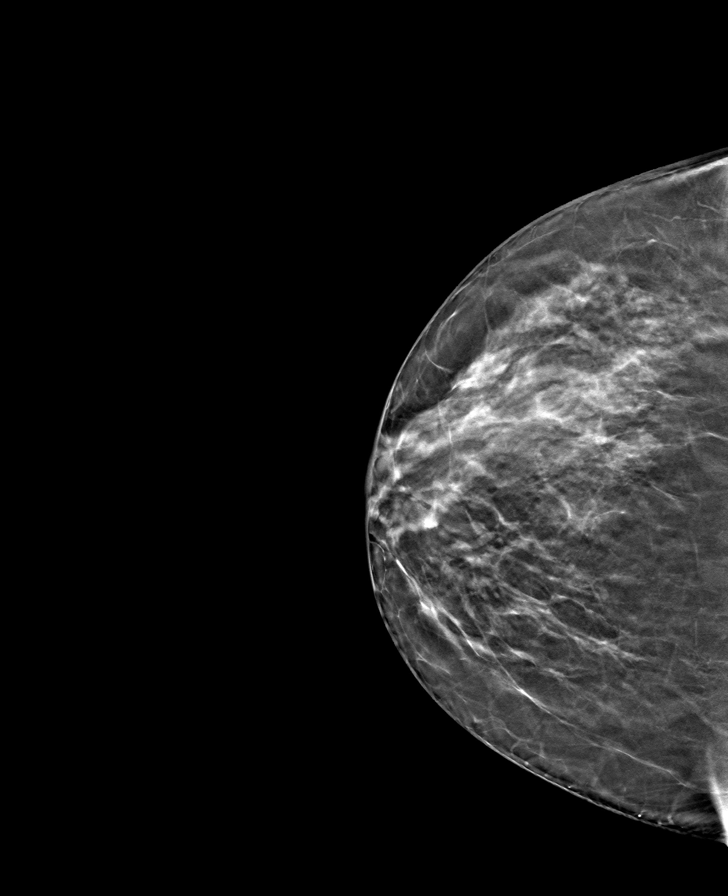

[8 of 24 positions shown; findings below may reference images not displayed]

ACR Breast Density Category c: The breast tissue is heterogeneously
dense, which may obscure small masses.
FINDINGS: There are no findings suspicious for malignancy.
IMPRESSION: No mammographic evidence of malignancy. A result letter of this
screening mammogram will be mailed directly to the patient.

RECOMMENDATION:
Screening mammogram in one year. (Code:Q3-W-BC3)

BI-RADS CATEGORY  1: Negative.

## 2022-07-20 ENCOUNTER — Ambulatory Visit (INDEPENDENT_AMBULATORY_CARE_PROVIDER_SITE_OTHER): Payer: No Typology Code available for payment source | Admitting: Family Medicine

## 2022-07-20 DIAGNOSIS — Z91199 Patient's noncompliance with other medical treatment and regimen due to unspecified reason: Secondary | ICD-10-CM

## 2022-07-20 NOTE — Progress Notes (Signed)
  Patient was not seen and was a no call/no show for her appt.    Jacky Kindle, FNP  [facility] [phone] (phone) [phone] (fax)  [facility]

## 2022-07-24 ENCOUNTER — Encounter: Payer: Self-pay | Admitting: Family Medicine

## 2022-07-24 ENCOUNTER — Ambulatory Visit: Payer: No Typology Code available for payment source | Admitting: Family Medicine

## 2022-07-24 DIAGNOSIS — Z23 Encounter for immunization: Secondary | ICD-10-CM | POA: Diagnosis not present

## 2022-07-24 DIAGNOSIS — E7800 Pure hypercholesterolemia, unspecified: Secondary | ICD-10-CM | POA: Diagnosis not present

## 2022-07-24 DIAGNOSIS — E538 Deficiency of other specified B group vitamins: Secondary | ICD-10-CM

## 2022-07-24 DIAGNOSIS — R7303 Prediabetes: Secondary | ICD-10-CM | POA: Diagnosis not present

## 2022-07-24 DIAGNOSIS — I10 Essential (primary) hypertension: Secondary | ICD-10-CM | POA: Diagnosis not present

## 2022-07-24 DIAGNOSIS — E559 Vitamin D deficiency, unspecified: Secondary | ICD-10-CM | POA: Insufficient documentation

## 2022-07-24 MED ORDER — AMLODIPINE BESYLATE 10 MG PO TABS: 10.0000 mg | ORAL_TABLET | Freq: Every day | ORAL | 3 refills | Status: DC

## 2022-07-24 NOTE — Assessment & Plan Note (Signed)
Chronic, slight weight gain- patient concerned and plans to address diet and exercise Body mass index is 35.94 kg/m. Associated with HTN, HLD, prediabetes

## 2022-07-24 NOTE — Patient Instructions (Signed)
The CDC recommends two doses of Shingrix (the new shingles vaccine) separated by 2 to 6 months for adults age 57 years and older. I recommend checking with your insurance plan regarding coverage for this vaccine.    Please schedule 2nd vaccine

## 2022-07-24 NOTE — Assessment & Plan Note (Signed)
Chronic, previously stable Repeat A1c in setting of weight gain Continue to recommend balanced, lower carb meals. Smaller meal size, adding snacks. Choosing water as drink of choice and increasing purposeful exercise.

## 2022-07-24 NOTE — Assessment & Plan Note (Signed)
Chronic, previously low On OTC supplements Repeat labs in setting of prediabetes, HTN, HLD

## 2022-07-24 NOTE — Assessment & Plan Note (Signed)
Chronic, stable At goal <130/<80 Continue Norvasc at 10 mg

## 2022-07-24 NOTE — Assessment & Plan Note (Signed)
Chronic, previously elevated Repeat LP The 10-year ASCVD risk score (Arnett DK, et al., 2019) is: 4.1% recommend diet low in saturated fat and regular exercise - 30 min at least 5 times per week

## 2022-07-24 NOTE — Assessment & Plan Note (Signed)
Chronic, previously low On OTC supplement Repeat labs Denies anemia or concern for bruising

## 2022-07-24 NOTE — Progress Notes (Signed)
I,[name],acting as a Neurosurgeon for Jacky Kindle, FNP.,have documented all relevant documentation on the behalf of Jacky Kindle, FNP,as directed by  Jacky Kindle, FNP while in the presence of Jacky Kindle, FNP.     Established patient visit   Patient: Michelle Shea   DOB: [birth year]   56 y.o. Female  MRN: [id number] Visit Date: 07/24/2022  Today's healthcare provider: Jacky Kindle, FNP  Re Introduced to nurse practitioner role and practice setting.  All questions answered.  Discussed provider/patient relationship and expectations.  Chief Complaint  Patient presents with   Hypertension   Hyperlipidemia   Hyperglycemia   Subjective    HPI  Prediabetes, Follow-up  Lab Results  Component Value Date   HGBA1C 5.9 (H) 01/18/2022   GLUCOSE 89 01/18/2022   GLUCOSE 97 11/04/2018   GLUCOSE 89 10/02/2018    Last seen for for this6 months ago.  Management since that visit includes no changes. Current symptoms include none and have been stable.  Pertinent Labs:    Component Value Date/Time   CHOL 191 01/18/2022 1120   TRIG 137 01/18/2022 1120   CHOLHDL 4.2 01/18/2022 1120   CREATININE 0.90 01/18/2022 1120   CREATININE 0.73 01/15/2017 1209    Wt Readings from Last 3 Encounters:  07/24/22 216 lb (98 kg)  01/18/22 213 lb (96.6 kg)  06/23/21 209 lb (94.8 kg)    -----------------------------------------------------------------------------------------  Hypertension, follow-up  BP Readings from Last 3 Encounters:  07/24/22 115/80  01/18/22 125/87  06/23/21 126/89   Wt Readings from Last 3 Encounters:  07/24/22 216 lb (98 kg)  01/18/22 213 lb (96.6 kg)  06/23/21 209 lb (94.8 kg)     She was last seen for hypertension 6 months ago.  BP at that visit was 125/87. Management since that visit includes no changes. She reports excellent compliance with treatment. She is not having side effects.   Outside blood pressures are not being  checked. -------------------------------------------------------------------------------------------------- Lipid/Cholesterol, follow-up  Last Lipid Panel: Lab Results  Component Value Date   CHOL 191 01/18/2022   LDLCALC 121 (H) 01/18/2022   HDL 46 01/18/2022   TRIG 137 01/18/2022    She was last seen for this 6 months ago.  Management since that visit includes no changes.  Last metabolic panel Lab Results  Component Value Date   GLUCOSE 89 01/18/2022   NA 142 01/18/2022   K 3.6 01/18/2022   BUN 11 01/18/2022   CREATININE 0.90 01/18/2022   EGFR 75 01/18/2022   GFRNONAA 87 11/04/2018   CALCIUM 9.0 01/18/2022   AST 124 (H) 01/18/2022   ALT 33 (H) 01/18/2022   The 10-year ASCVD risk score (Arnett DK, et al., 2019) is: 4.1%  ---------------------------------------------------------------------------------------------------   Medications: Outpatient Medications Prior to Visit  Medication Sig   [DISCONTINUED] amLODipine (NORVASC) 10 MG tablet Take 1 tablet (10 mg total) by mouth daily.   No facility-administered medications prior to visit.    Review of Systems  Constitutional:  Negative for appetite change and fatigue.  Eyes:  Negative for visual disturbance.  Respiratory:  Negative for cough, chest tightness and shortness of breath.   Cardiovascular:  Positive for leg swelling. Negative for chest pain.  Gastrointestinal:  Negative for abdominal pain, nausea and vomiting.  Neurological:  Negative for dizziness, light-headedness and headaches.     Objective    BP 115/80 (BP Location: Left Arm, Patient Position: Sitting, Cuff Size: Large)   Pulse 82   Temp  98.1 F (36.7 C) (Temporal)   Resp 16   Ht 5\' 5"  (1.651 m)   Wt 216 lb (98 kg)   BMI 35.94 kg/m   BP Readings from Last 3 Encounters:  07/24/22 115/80  01/18/22 125/87  06/23/21 126/89   Wt Readings from Last 3 Encounters:  07/24/22 216 lb (98 kg)  01/18/22 213 lb (96.6 kg)  06/23/21 209 lb (94.8 kg)    Physical Exam Vitals and nursing note reviewed.  Constitutional:      General: She is not in acute distress.    Appearance: Normal appearance. She is obese. She is not ill-appearing, toxic-appearing or diaphoretic.  HENT:     Head: Normocephalic and atraumatic.  Cardiovascular:     Rate and Rhythm: Normal rate and regular rhythm.     Pulses: Normal pulses.     Heart sounds: Normal heart sounds. No murmur heard.    No friction rub. No gallop.  Pulmonary:     Effort: Pulmonary effort is normal. No respiratory distress.     Breath sounds: Normal breath sounds. No stridor. No wheezing, rhonchi or rales.  Chest:     Chest wall: No tenderness.  Abdominal:     General: Bowel sounds are normal.     Palpations: Abdomen is soft.  Musculoskeletal:        General: No swelling, tenderness, deformity or signs of injury. Normal range of motion.     Right lower leg: No edema.     Left lower leg: No edema.  Skin:    General: Skin is warm and dry.     Capillary Refill: Capillary refill takes less than 2 seconds.     Coloration: Skin is not jaundiced or pale.     Findings: No bruising, erythema, lesion or rash.  Neurological:     General: No focal deficit present.     Mental Status: She is alert and oriented to person, place, and time. Mental status is at baseline.     Cranial Nerves: No cranial nerve deficit.     Sensory: No sensory deficit.     Motor: No weakness.     Coordination: Coordination normal.  Psychiatric:        Mood and Affect: Mood normal.        Behavior: Behavior normal.        Thought Content: Thought content normal.        Judgment: Judgment normal.    No results found for any visits on 07/24/22.  Assessment & Plan     Problem List Items Addressed This Visit       Cardiovascular and Mediastinum   BP (high blood pressure)    Chronic, stable At goal <130/<80 Continue Norvasc at 10 mg        Relevant Medications   amLODipine (NORVASC) 10 MG tablet   Other  Relevant Orders   Comprehensive metabolic panel   CBC with Differential/Platelet     Other   Avitaminosis D    Chronic, previously low On OTC supplements Repeat labs in setting of prediabetes, HTN, HLD       Relevant Orders   VITAMIN D 25 Hydroxy (Vit-D Deficiency, Fractures)   B12 deficiency    Chronic, previously low On OTC supplement Repeat labs Denies anemia or concern for bruising       Relevant Orders   Vitamin B12   Elevated LDL cholesterol level    Chronic, previously elevated Repeat LP The 10-year ASCVD risk score (Arnett DK, et  al., 2019) is: 4.1% recommend diet low in saturated fat and regular exercise - 30 min at least 5 times per week       Relevant Orders   Lipid Panel With LDL/HDL Ratio   Morbid obesity (HCC) - Primary    Chronic, slight weight gain- patient concerned and plans to address diet and exercise Body mass index is 35.94 kg/m. Associated with HTN, HLD, prediabetes       Need for shingles vaccine   Relevant Orders   Varicella-zoster vaccine IM (Completed)   Prediabetes    Chronic, previously stable Repeat A1c in setting of weight gain Continue to recommend balanced, lower carb meals. Smaller meal size, adding snacks. Choosing water as drink of choice and increasing purposeful exercise.       Relevant Orders   Hemoglobin A1c   Return in about 8 weeks (around 09/18/2022) for immunization.    Leilani Merl, FNP, have reviewed all documentation for this visit. The documentation on 07/24/22 for the exam, diagnosis, procedures, and orders are all accurate and complete.  Jacky Kindle, FNP  [facility] [phone] (phone) [phone] (fax)  [facility]

## 2022-07-25 ENCOUNTER — Other Ambulatory Visit: Payer: Self-pay | Admitting: Family Medicine

## 2022-07-25 LAB — VITAMIN B12: Vitamin B-12: 231 pg/mL — ABNORMAL LOW (ref 232–1245)

## 2022-07-25 LAB — COMPREHENSIVE METABOLIC PANEL
ALT: 13 IU/L (ref 0–32)
AST: 14 IU/L (ref 0–40)
Albumin/Globulin Ratio: 1.4 (ref 1.2–2.2)
Albumin: 3.9 g/dL (ref 3.8–4.9)
Alkaline Phosphatase: 131 IU/L — ABNORMAL HIGH (ref 44–121)
BUN/Creatinine Ratio: 13 (ref 9–23)
BUN: 13 mg/dL (ref 6–24)
Bilirubin Total: 0.5 mg/dL (ref 0.0–1.2)
CO2: 22 mmol/L (ref 20–29)
Calcium: 8.9 mg/dL (ref 8.7–10.2)
Chloride: 107 mmol/L — ABNORMAL HIGH (ref 96–106)
Creatinine, Ser: 0.97 mg/dL (ref 0.57–1.00)
Globulin, Total: 2.8 g/dL (ref 1.5–4.5)
Glucose: 93 mg/dL (ref 70–99)
Potassium: 3.9 mmol/L (ref 3.5–5.2)
Sodium: 145 mmol/L — ABNORMAL HIGH (ref 134–144)
Total Protein: 6.7 g/dL (ref 6.0–8.5)
eGFR: 69 mL/min/{1.73_m2} (ref >59)

## 2022-07-25 LAB — CBC WITH DIFFERENTIAL/PLATELET
Basophils Absolute: 0.1 10*3/uL (ref 0.0–0.2)
Basos: 1 %
EOS (ABSOLUTE): 0.1 10*3/uL (ref 0.0–0.4)
Eos: 1 %
Hematocrit: 36.8 % (ref 34.0–46.6)
Hemoglobin: 12.1 g/dL (ref 11.1–15.9)
Immature Grans (Abs): 0.1 10*3/uL (ref 0.0–0.1)
Immature Granulocytes: 1 %
Lymphocytes Absolute: 2.2 10*3/uL (ref 0.7–3.1)
Lymphs: 29 %
MCH: 28.5 pg (ref 26.6–33.0)
MCHC: 32.9 g/dL (ref 31.5–35.7)
MCV: 87 fL (ref 79–97)
Monocytes Absolute: 0.6 10*3/uL (ref 0.1–0.9)
Monocytes: 8 %
Neutrophils Absolute: 4.7 10*3/uL (ref 1.4–7.0)
Neutrophils: 60 %
Platelets: 254 10*3/uL (ref 150–450)
RBC: 4.25 x10E6/uL (ref 3.77–5.28)
RDW: 13.8 % (ref 11.7–15.4)
WBC: 7.7 10*3/uL (ref 3.4–10.8)

## 2022-07-25 LAB — HEMOGLOBIN A1C
Est. average glucose Bld gHb Est-mCnc: 126 mg/dL
Hgb A1c MFr Bld: 6 % — ABNORMAL HIGH (ref 4.8–5.6)

## 2022-07-25 LAB — LIPID PANEL WITH LDL/HDL RATIO
Cholesterol, Total: 179 mg/dL (ref 100–199)
HDL: 40 mg/dL (ref >39)
LDL Chol Calc (NIH): 122 mg/dL — ABNORMAL HIGH (ref 0–99)
LDL/HDL Ratio: 3.1 ratio (ref 0.0–3.2)
Triglycerides: 91 mg/dL (ref 0–149)
VLDL Cholesterol Cal: 17 mg/dL (ref 5–40)

## 2022-07-25 LAB — VITAMIN D 25 HYDROXY (VIT D DEFICIENCY, FRACTURES): Vit D, 25-Hydroxy: 8.7 ng/mL — ABNORMAL LOW (ref 30.0–100.0)

## 2022-07-25 MED ORDER — VITAMIN D (ERGOCALCIFEROL) 1.25 MG (50000 UNIT) PO CAPS: 50000.0000 [IU] | ORAL_CAPSULE | ORAL | 0 refills | Status: AC

## 2022-07-25 NOTE — Progress Notes (Signed)
Blood chemistry is relatively stable; chronic elevation in alkaline phosphatase enzyme remains. I continue to recommend diet low in saturated fat and regular exercise - 30 min at least 5 times per week. The 10-year ASCVD risk score (Arnett DK, et al., 2019) is: 4.4%. A1c confirms stable pre-diabetes. Continue to recommend balanced, lower carb meals. Smaller meal size, adding snacks. Choosing water as drink of choice and increasing purposeful exercise. Vit D is improved; however, remains low. Continue to recommend 5000 IU Vit D3 daily or once weekly Rx supplement. B12 is improved; however, also remains low. Continue to recommend oral supplement or start of injections to assist.

## 2022-08-08 ENCOUNTER — Encounter: Payer: Self-pay | Admitting: Family Medicine

## 2022-08-09 ENCOUNTER — Other Ambulatory Visit: Payer: Self-pay | Admitting: Family Medicine

## 2022-08-09 DIAGNOSIS — I10 Essential (primary) hypertension: Secondary | ICD-10-CM

## 2022-08-09 MED ORDER — METFORMIN HCL ER 750 MG PO TB24: 750.0000 mg | ORAL_TABLET | Freq: Two times a day (BID) | ORAL | 1 refills | Status: AC

## 2022-08-09 MED ORDER — AMLODIPINE BESYLATE 10 MG PO TABS: 5.0000 mg | ORAL_TABLET | Freq: Every day | ORAL | 3 refills | Status: DC

## 2022-08-09 MED ORDER — LISINOPRIL-HYDROCHLOROTHIAZIDE 20-12.5 MG PO TABS: 1.0000 | ORAL_TABLET | Freq: Every day | ORAL | 1 refills | Status: DC

## 2022-10-14 ENCOUNTER — Ambulatory Visit
Admission: EM | Admit: 2022-10-14 | Discharge: 2022-10-14 | Disposition: A | Discharge status: Home / Self Care | Payer: No Typology Code available for payment source | Attending: Emergency Medicine | Admitting: Emergency Medicine

## 2022-10-14 DIAGNOSIS — R509 Fever, unspecified: Secondary | ICD-10-CM | POA: Insufficient documentation

## 2022-10-14 DIAGNOSIS — U071 COVID-19: Secondary | ICD-10-CM | POA: Diagnosis present

## 2022-10-14 MED ORDER — NIRMATRELVIR/RITONAVIR (PAXLOVID)TABLET: 3.0000 | ORAL_TABLET | Freq: Two times a day (BID) | ORAL | 0 refills | Status: AC

## 2022-10-14 NOTE — Discharge Instructions (Addendum)
Take the Paxlovid as directed.  Take Tylenol as needed for fever or discomfort.  Rest and keep yourself hydrated.      Go to the emergency department if you have shortness of breath or other concerning symptoms.    Call your primary care provider to let them know that you are COVID positive and taking Paxlovid.

## 2022-10-14 NOTE — ED Provider Notes (Signed)
[name]    CSN: [id number] Arrival date & time: 10/14/22  1341      History   Chief Complaint Chief Complaint  Patient presents with   Fever   Generalized Body Aches    HPI Michelle Michelle is a 57 y.o. female.  Accompanied by her daughter, patient presents with 3 day history of fever, chills, body aches.  She had positive COVID test at home on 10/12/2022.  Tmax 101.9.  She denies cough, shortness of breath, or other symptoms.  Treatment with Tylenol.  Her work requires a PCR COVID test.  Her medical history includes hypertension, ovarian cancer, morbid obesity.  The history is provided by the patient, a relative and medical records.    Past Medical History:  Diagnosis Date   Anemia    Endometriosis    Hypertension    Ovarian cancer [facility])     Patient Active Problem List   Diagnosis Date Noted   Need for shingles vaccine 07/24/2022   Morbid obesity (HCC) 07/24/2022   Prediabetes 07/24/2022   Avitaminosis D 07/24/2022   B12 deficiency 07/24/2022   Elevated LDL cholesterol level 01/18/2022   Other specified postprocedural states 05/28/2012   BP (high blood pressure) 05/19/2012    Past Surgical History:  Procedure Laterality Date   ABDOMINAL HYSTERECTOMY     2014 TAH with bilateral BSO   COLONOSCOPY WITH PROPOFOL N/A 05/09/2021   Procedure: COLONOSCOPY WITH PROPOFOL;  Surgeon: Wyline Mood, MD;  Location: [facility]COPY;  Service: Gastroenterology;  Laterality: N/A;   ENDOMETRIAL BIOPSY      OB History   No obstetric history on file.      Home Medications    Prior to Admission medications   Medication Sig Start Date End Date Taking? Authorizing Provider  nirmatrelvir/ritonavir (PAXLOVID) 20 x 150 MG & 10 x 100MG  TABS Take 3 tablets by mouth 2 (two) times daily for 5 days. Patient GFR is 83. Take nirmatrelvir (150 mg) two tablets twice daily for 5 days and ritonavir (100 mg) one tablet twice daily for 5 days. 10/14/22 10/19/22 Yes Mickie Bail, NP   amLODipine (NORVASC) 10 MG tablet Take 0.5 tablets (5 mg total) by mouth daily. 08/09/22   Jacky Kindle, FNP  lisinopril-hydrochlorothiazide (ZESTORETIC) 20-12.5 MG tablet Take 1 tablet by mouth daily. 08/09/22   Jacky Kindle, FNP  metFORMIN (GLUCOPHAGE-XR) 750 MG 24 hr tablet Take 1 tablet (750 mg total) by mouth 2 (two) times daily before a meal. Patient not taking: Reported on 10/14/2022 08/09/22   Jacky Kindle, FNP  Vitamin D, Ergocalciferol, (DRISDOL) 1.25 MG (50000 UNIT) CAPS capsule Take 1 capsule (50,000 Units total) by mouth every 7 (seven) days. Patient not taking: Reported on 10/14/2022 07/25/22   Jacky Kindle, FNP    Family History Family History  Problem Relation Age of Onset   Breast cancer Mother 26   Breast cancer Maternal Aunt 58    Social History Social History   Tobacco Use   Smoking status: Never   Smokeless tobacco: Never  Vaping Use   Vaping status: Never Used  Substance Use Topics   Alcohol use: Yes    Alcohol/week: 0.0 standard drinks of alcohol    Comment: sometimes   Drug use: No     Allergies   Patient has no known allergies.   Review of Systems Review of Systems  Constitutional:  Positive for chills and fever.  HENT:  Negative for ear pain and sore throat.  Respiratory:  Negative for cough and shortness of breath.   Cardiovascular:  Negative for chest pain and palpitations.     Physical Exam Triage Vital Signs ED Triage Vitals  Encounter Vitals Group     BP      Systolic BP Percentile      Diastolic BP Percentile      Pulse      Resp      Temp      Temp src      SpO2      Weight      Height      Head Circumference      Peak Flow      Pain Score      Pain Loc      Pain Education      Exclude from Growth Chart    No data found.  Updated Vital Signs BP 107/80   Pulse 95   Temp 99.6 F (37.6 C)   Resp 20   SpO2 95%   Visual Acuity Right Eye Distance:   Left Eye Distance:   Bilateral Distance:    Right Eye  Near:   Left Eye Near:    Bilateral Near:     Physical Exam Vitals and nursing note reviewed.  Constitutional:      General: She is not in acute distress.    Appearance: She is well-developed.  HENT:     Right Ear: Tympanic membrane normal.     Left Ear: Tympanic membrane normal.     Nose: Nose normal.     Mouth/Throat:     Mouth: Mucous membranes are moist.     Pharynx: Oropharynx is clear.  Cardiovascular:     Rate and Rhythm: Normal rate and regular rhythm.     Heart sounds: Normal heart sounds.  Pulmonary:     Effort: Pulmonary effort is normal. No respiratory distress.     Breath sounds: Normal breath sounds.  Musculoskeletal:     Cervical back: Neck supple.  Skin:    General: Skin is warm and dry.  Neurological:     Mental Status: She is alert.      UC Treatments / Results  Labs (all labs ordered are listed, but only abnormal results are displayed) Labs Reviewed - No data to display  EKG   Radiology No results found.  Procedures Procedures (including critical care time)  Medications Ordered in UC Medications - No data to display  Initial Impression / Assessment and Plan / UC Course  I have reviewed the triage vital signs and the nursing notes.  Pertinent labs & imaging results that were available during my care of the patient were reviewed by me and considered in my medical decision making (see chart for details).   Positive at-home COVID test, fever.  Patient tested positive for COVID on 10/12/2022.  Her work will not accept an at-home test.  Per her request, PCR COVID pending.  Treating with Paxlovid.  GFR 69 on 07/24/2022.  Discussed possible side effects of Paxlovid, including dysgeusia, diarrhea, myalgias, hypertension.  Also discussed the possibility of rebound COVID.  Instructed patient to notify her PCP that she is COVID positive and taking Paxlovid.  ED precautions discussed.  Patient agrees to plan of care.   Final Clinical Impressions(s) / UC  Diagnoses   Final diagnoses:  Positive self-administered antigen test for COVID-19  Fever, unspecified     Discharge Instructions      Take the Paxlovid as directed.  Take  Tylenol as needed for fever or discomfort.  Rest and keep yourself hydrated.      Go to the emergency department if you have shortness of breath or other concerning symptoms.    Call your primary care provider to let them know that you are COVID positive and taking Paxlovid.         ED Prescriptions     Medication Sig Dispense Auth. Provider   nirmatrelvir/ritonavir (PAXLOVID) 20 x 150 MG & 10 x 100MG  TABS Take 3 tablets by mouth 2 (two) times daily for 5 days. Patient GFR is 83. Take nirmatrelvir (150 mg) two tablets twice daily for 5 days and ritonavir (100 mg) one tablet twice daily for 5 days. 30 tablet Mickie Bail, NP      PDMP not reviewed this encounter.   Mickie Bail, NP 10/14/22 [phone]

## 2022-10-14 NOTE — ED Triage Notes (Signed)
Patient to Urgent Care with complaints of fevers/ chills/ generalized body aches. Symptoms started three days ago.   Requests Covid swab. Fever of 101.9 on Friday. Max temp.  Positive home covid test.

## 2022-10-16 ENCOUNTER — Telehealth: Payer: Self-pay

## 2022-10-16 ENCOUNTER — Encounter: Payer: Self-pay | Admitting: Family Medicine

## 2022-10-16 NOTE — Telephone Encounter (Signed)
Copied from CRM [id number]. Topic: General - Inquiry >> Oct 15, 2022  2:44 PM Marlow Baars wrote: Reason for CRM: The patient called in stating she was seen yesterday at the urgent care and was tested for covid. She was positive with her at home test but still waiting on results from urgent care. They did call paxlovid in for her but unfortunately her pharmacy does not currently have it in stock. They will call her as soon as the medicine is in. She just wanted her provider to know. Please assist patient further if necessary.

## 2022-10-23 ENCOUNTER — Other Ambulatory Visit: Payer: Self-pay | Admitting: Family Medicine

## 2022-11-23 ENCOUNTER — Telehealth (INDEPENDENT_AMBULATORY_CARE_PROVIDER_SITE_OTHER): Payer: No Typology Code available for payment source | Admitting: Physician Assistant

## 2022-11-23 ENCOUNTER — Ambulatory Visit: Payer: Self-pay

## 2022-11-23 DIAGNOSIS — M7989 Other specified soft tissue disorders: Secondary | ICD-10-CM | POA: Diagnosis not present

## 2022-11-23 DIAGNOSIS — I10 Essential (primary) hypertension: Secondary | ICD-10-CM | POA: Diagnosis not present

## 2022-11-23 MED ORDER — LISINOPRIL-HYDROCHLOROTHIAZIDE 20-12.5 MG PO TABS
1.0000 | ORAL_TABLET | Freq: Every day | ORAL | 0 refills | Status: DC
Start: 2022-11-23 — End: 2022-12-27

## 2022-11-23 MED ORDER — AMLODIPINE BESYLATE 2.5 MG PO TABS
2.5000 mg | ORAL_TABLET | Freq: Every day | ORAL | 0 refills | Status: DC
Start: 2022-11-23 — End: 2023-02-19

## 2022-11-23 NOTE — Telephone Encounter (Signed)
  Chief Complaint: Left leg Symptoms: Swollen ankle Frequency: 2 weeks Pertinent Negatives: Patient denies  Disposition: [] ED /[] Urgent Care (no appt availability in office) / [x] Appointment(In office/virtual)/ []  [place] [facility] [facility]/ [] Home Care/ [] Refused Recommended Disposition /[] [facility]/ []  Follow-up with PCP Additional Notes: Pt states that her left leg has been swollen for 2 weeks. Pt thinks it may be from the amlodipine.  Summary: Left leg swollen   Left leg swollen (two weeks) Questioning medication .Please advise     Reason for Disposition  [1] Thigh, calf, or ankle swelling AND [2] only 1 side  Answer Assessment - Initial Assessment Questions 1. ONSET: "When did the swelling start?" (e.g., minutes, hours, days)     2 weeks 2. LOCATION: "What part of the leg is swollen?"  "Are both legs swollen or just one leg?"     Left leg 3. SEVERITY: "How bad is the swelling?" (e.g., localized; mild, moderate, severe)   - Localized: Small area of swelling localized to one leg.   - MILD pedal edema: Swelling limited to foot and ankle, pitting edema < 1/4 inch (6 mm) deep, rest and elevation eliminate most or all swelling.   - MODERATE edema: Swelling of lower leg to knee, pitting edema > 1/4 inch (6 mm) deep, rest and elevation only partially reduce swelling.   - SEVERE edema: Swelling extends above knee, facial or hand swelling present.      Mild moderate 4. REDNESS: "Does the swelling look red or infected?"     no 5. PAIN: "Is the swelling painful to touch?" If Yes, ask: "How painful is it?"   (Scale 1-10; mild, moderate or severe)     no 7. CAUSE: "What do you think is causing the leg swelling?"     Amlodipine 10. OTHER SYMPTOMS: "Do you have any other symptoms?" (e.g., chest pain, difficulty breathing)       no  Protocols used: Leg Swelling and Edema-A-AH

## 2022-11-25 ENCOUNTER — Encounter: Payer: Self-pay | Admitting: Physician Assistant

## 2022-11-25 NOTE — Progress Notes (Signed)
MyChart Video Visit  Virtual Visit via Video Note   This format is felt to be most appropriate for this patient at this time. Physical exam was limited by quality of the video and audio technology used for the visit.   Patient location: office Patient Location: Home  I discussed the limitations of evaluation and management by telemedicine and the availability of in person appointments. The patient expressed understanding and agreed to proceed.  Patient: Michelle Shea   DOB: 03/04/1966   57 y.o. Female  MRN: [id number] Visit Date: 11/23/2022  Today's healthcare provider: Debera Lat, PA-C   Chief Complaint  Patient presents with   Medical Management of Chronic Issues    Swollen ankles, has been on and off for awhile while taking BP medication, has had real bad cramps in legs before   Subjective     Discussed the use of AI scribe software for clinical note transcription with the patient, who gave verbal consent to proceed.  History of Present Illness   The patient, with a history of hypertension, presents with left leg swelling. The swelling is not painful and extends from the ankle to the calf. The patient denies any recent trauma or wounds and has no associated knee or ankle problems. 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 The patient has noticed this swelling intermittently, with a significant episode in April that required them to elevate their legs and limit activity. The current swelling is less severe than the episode in April.  The patient's hypertension is managed with lisinopril and hydrochlorothiazide (Zestoretic), and they were previously on amlodipine. The patient reports that their blood pressure has been well-controlled with these medications, as confirmed by their daughter who is in the medical field. The patient does not regularly monitor their blood pressure at home but reports feeling fine and not experiencing any  symptoms of elevated blood pressure such as headaches.         Medications: Outpatient Medications Prior to Visit  Medication Sig   Vitamin D, Ergocalciferol, (DRISDOL) 1.25 MG (50000 UNIT) CAPS capsule Take 1 capsule (50,000 Units total) by mouth every 7 (seven) days.   [DISCONTINUED] amLODipine (NORVASC) 10 MG tablet Take 0.5 tablets (5 mg total) by mouth daily.   [DISCONTINUED] lisinopril-hydrochlorothiazide (ZESTORETIC) 20-12.5 MG tablet Take 1 tablet by mouth once daily   metFORMIN (GLUCOPHAGE-XR) 750 MG 24 hr tablet Take 1 tablet (750 mg total) by mouth 2 (two) times daily before a meal. (Patient not taking: Reported on 11/23/2022)   No facility-administered medications prior to visit.    Review of Systems  All other systems reviewed and are negative.  Except see HPI       Objective    There were no vitals taken for this visit.      Physical Exam Constitutional:      General: She is not in acute distress.    Appearance: Normal appearance.  HENT:     Head: Normocephalic.  Pulmonary:     Effort: Pulmonary effort is normal. No respiratory distress.  Neurological:     Mental Status: She is alert and oriented to person, place, and time. Mental status is at baseline.        Assessment & Plan     Left Leg Swelling Unilateral leg swelling without pain or trauma-very mild. No visible lesions or wounds. Possible overuse due to work in a warehouse vs use of amlodipine, per previous episode. Denies SOB< CP, palpitations.  No clear evidence of deep vein thrombosis. -Continue to monitor for changes in swelling or onset of pain. Pt needs to be seen in person  Hypertension Currently managed with Lisinopril and Hydrochlorothiazide. Blood pressure reportedly well-controlled, but no recent measurements provided. -Decrease Amlodipine to 2.5mg  daily. Goal is to d/c amlodipine if it cause swelling? -Monitor blood pressure daily and record results. -Return to clinic if blood  pressure consistently exceeds 140/90.  Follow-up in 2 weeks to assess blood pressure control and leg swelling. If blood pressure is not well-controlled on the reduced dose of Amlodipine, further medication adjustments may be necessary.     Return in about 2 weeks (around 12/07/2022) for BP f/u and leg swelling. In person not VV    I discussed the assessment and treatment plan with the patient. The patient was provided an opportunity to ask questions and all were answered. The patient agreed with the plan and demonstrated an understanding of the instructions.   The patient was advised to call back or seek an in-person evaluation if the symptoms worsen or if the condition fails to improve as anticipated.  I provided 13 minutes of non-face-to-face time during this encounter.  I, Debera Lat, PA-C have reviewed all documentation for this visit. The documentation on  11/23/22 for the exam, diagnosis, procedures, and orders are all accurate and complete.  Debera Lat, [facility], MMS [facility] [phone] (phone) [phone] (fax)  [facility]

## 2022-12-27 ENCOUNTER — Other Ambulatory Visit: Payer: Self-pay | Admitting: Family Medicine

## 2022-12-27 DIAGNOSIS — I10 Essential (primary) hypertension: Secondary | ICD-10-CM

## 2022-12-27 MED ORDER — LISINOPRIL-HYDROCHLOROTHIAZIDE 20-12.5 MG PO TABS
1.0000 | ORAL_TABLET | Freq: Every day | ORAL | 0 refills | Status: DC
Start: 2022-12-27 — End: 2023-02-19

## 2022-12-27 NOTE — Telephone Encounter (Signed)
Medication Refill - Medication: lisinopril-hydrochlorothiazide (ZESTORETIC) 20-12.5 MG tablet   Has the patient contacted their pharmacy? No. (Agent: If no, request that the patient contact the pharmacy for the refill. If patient does not wish to contact the pharmacy document the reason why and proceed with request.) (Agent: If yes, when and what did the pharmacy advise?)  Preferred Pharmacy (with phone number or street name):  [facility] [address] [place]), [place] - [address] Phone: [phone]  Fax: [phone]     Has the patient been seen for an appointment in the last year OR does the patient have an upcoming appointment? Yes.    Agent: Please be advised that RX refills may take up to 3 business days. We ask that you follow-up with your pharmacy.

## 2022-12-27 NOTE — Telephone Encounter (Signed)
Requested Prescriptions  Pending Prescriptions Disp Refills   lisinopril-hydrochlorothiazide (ZESTORETIC) 20-12.5 MG tablet 90 tablet 0    Sig: Take 1 tablet by mouth daily.     Cardiovascular:  ACEI + Diuretic Combos Failed - 12/27/2022  3:47 PM      Failed - Na in normal range and within 180 days    Sodium  Date Value Ref Range Status  07/24/2022 145 (H) 134 - 144 mmol/L Final         Passed - K in normal range and within 180 days    Potassium  Date Value Ref Range Status  07/24/2022 3.9 3.5 - 5.2 mmol/L Final         Passed - Cr in normal range and within 180 days    Creat  Date Value Ref Range Status  01/15/2017 0.73 0.50 - 1.05 mg/dL Final    Comment:    For patients >3 years of age, the reference limit for Creatinine is approximately 13% higher for people identified as African-American. .    Creatinine, Ser  Date Value Ref Range Status  07/24/2022 0.97 0.57 - 1.00 mg/dL Final         Passed - eGFR is 30 or above and within 180 days    GFR, Est African American  Date Value Ref Range Status  01/15/2017 111 > OR = 60 mL/min/1.69m2 Final   GFR calc Af Amer  Date Value Ref Range Status  11/04/2018 100 >59 mL/min/1.73 Final   GFR, Est Non African American  Date Value Ref Range Status  01/15/2017 95 > OR = 60 mL/min/1.14m2 Final   GFR calc non Af Amer  Date Value Ref Range Status  11/04/2018 87 >59 mL/min/1.73 Final   eGFR  Date Value Ref Range Status  07/24/2022 69 >59 mL/min/1.73 Final         Passed - Patient is not pregnant      Passed - Last BP in normal range    BP Readings from Last 1 Encounters:  10/14/22 107/80         Passed - Valid encounter within last 6 months    Recent Outpatient Visits           1 month ago Left leg swelling   [facility] [place], [place], PA-C   5 months ago Morbid obesity [facility])   [facility] Jacky Kindle, FNP   5 months ago No-show for appointment   [facility] Jacky Kindle, FNP   11 months ago Leg cramping   [facility] Merita Norton T, FNP   1 year ago Increased urinary frequency    [facility] Mecum, Oswaldo Conroy, [place]

## 2023-02-18 ENCOUNTER — Other Ambulatory Visit: Payer: Self-pay | Admitting: Family Medicine

## 2023-02-18 DIAGNOSIS — I10 Essential (primary) hypertension: Secondary | ICD-10-CM

## 2023-02-18 NOTE — Telephone Encounter (Signed)
Medication Refill -  Most Recent Primary Care Visit:  Provider: Debera Lat  Department: [facility]  Visit Type: MYCHART VIDEO VISIT  Date: 11/23/2022  Medication: lisinopril-hydrochlorothiazide (ZESTORETIC) 20-12.5 MG tablet [[id number]  Has the patient contacted their pharmacy? NO  Is this the correct pharmacy for this prescription? Yes If no, delete pharmacy and type the correct one.  This is the patient's preferred pharmacy:  [facility] [address], [place] - [address] [place] [address] Phone: [phone] Fax: [phone]    Has the prescription been filled recently? Yes  Is the patient out of the medication? Yes  Has the patient been seen for an appointment in the last year OR does the patient have an upcoming appointment? Yes  Can we respond through MyChart? Yes  Agent: Please be advised that Rx refills may take up to 3 business days. We ask that you follow-up with your pharmacy.

## 2023-02-19 ENCOUNTER — Other Ambulatory Visit: Payer: Self-pay | Admitting: Physician Assistant

## 2023-02-19 ENCOUNTER — Other Ambulatory Visit: Payer: Self-pay | Admitting: Family Medicine

## 2023-02-19 DIAGNOSIS — I10 Essential (primary) hypertension: Secondary | ICD-10-CM

## 2023-02-19 MED ORDER — LISINOPRIL-HYDROCHLOROTHIAZIDE 20-12.5 MG PO TABS
1.0000 | ORAL_TABLET | Freq: Every day | ORAL | 0 refills | Status: DC
Start: 2023-02-19 — End: 2023-06-14

## 2023-02-19 MED ORDER — AMLODIPINE BESYLATE 2.5 MG PO TABS: 2.5000 mg | ORAL_TABLET | Freq: Every day | ORAL | 0 refills | Status: DC

## 2023-02-21 NOTE — Telephone Encounter (Signed)
Requested Prescriptions  Pending Prescriptions Disp Refills   lisinopril-hydrochlorothiazide (ZESTORETIC) 20-12.5 MG tablet 90 tablet 0    Sig: Take 1 tablet by mouth daily.     Cardiovascular:  ACEI + Diuretic Combos Failed - 02/18/2023  3:55 PM      Failed - Na in normal range and within 180 days    Sodium  Date Value Ref Range Status  07/24/2022 145 (H) 134 - 144 mmol/L Final         Failed - K in normal range and within 180 days    Potassium  Date Value Ref Range Status  07/24/2022 3.9 3.5 - 5.2 mmol/L Final         Failed - Cr in normal range and within 180 days    Creat  Date Value Ref Range Status  01/15/2017 0.73 0.50 - 1.05 mg/dL Final    Comment:    For patients >5 years of age, the reference limit for Creatinine is approximately 13% higher for people identified as African-American. .    Creatinine, Ser  Date Value Ref Range Status  07/24/2022 0.97 0.57 - 1.00 mg/dL Final         Failed - eGFR is 30 or above and within 180 days    GFR, Est African American  Date Value Ref Range Status  01/15/2017 111 > OR = 60 mL/min/1.80m2 Final   GFR calc Af Amer  Date Value Ref Range Status  11/04/2018 100 >59 mL/min/1.73 Final   GFR, Est Non African American  Date Value Ref Range Status  01/15/2017 95 > OR = 60 mL/min/1.93m2 Final   GFR calc non Af Amer  Date Value Ref Range Status  11/04/2018 87 >59 mL/min/1.73 Final   eGFR  Date Value Ref Range Status  07/24/2022 69 >59 mL/min/1.73 Final         Passed - Patient is not pregnant      Passed - Last BP in normal range    BP Readings from Last 1 Encounters:  10/14/22 107/80         Passed - Valid encounter within last 6 months    Recent Outpatient Visits           3 months ago Left leg swelling   [facility] [place], [place], PA-C   7 months ago Morbid obesity [facility])   [place] [place] [facility] Jacky Kindle, FNP   7 months ago No-show for appointment   [facility] Jacky Kindle, FNP   1 year ago Leg cramping   [facility] Merita Norton T, FNP   1 year ago Increased urinary frequency   [facility] Mecum, Oswaldo Conroy, [place]

## 2023-06-14 ENCOUNTER — Other Ambulatory Visit: Payer: Self-pay | Admitting: Family Medicine

## 2023-06-14 DIAGNOSIS — I10 Essential (primary) hypertension: Secondary | ICD-10-CM

## 2023-06-14 NOTE — Telephone Encounter (Signed)
 Copied from CRM [id number]. Topic: Clinical - Medical Advice >> Jun 14, 2023  1:38 PM Antwanette L wrote: Reason for CRM: Patient is coming from [place] to [place] on 4/21 and only has 11 days worth of lisinopril-hydrochlorothiazide (ZESTORETIC) 20-12.5 MG tablet. I sent a message to admin team about scheduling an appointment, but the patient will run of medicine before 4/21.The patient  wants to know if a provider can submit a refill request or a sample? Please contact patient at [phone].

## 2023-06-14 NOTE — Telephone Encounter (Signed)
 Requested medication (s) are due for refill today: Yes  Requested medication (s) are on the active medication list: Yes  Last refill:  03/11/23  Future visit scheduled: No  Notes to clinic:  See notes, patient requesting refill, lives in [place] but will be in [place].     Requested Prescriptions  Pending Prescriptions Disp Refills   lisinopril-hydrochlorothiazide (ZESTORETIC) 20-12.5 MG tablet 90 tablet 0    Sig: Take 1 tablet by mouth daily.     Cardiovascular:  ACEI + Diuretic Combos Failed - 06/14/2023  1:52 PM      Failed - Na in normal range and within 180 days    Sodium  Date Value Ref Range Status  07/24/2022 145 (H) 134 - 144 mmol/L Final         Failed - K in normal range and within 180 days    Potassium  Date Value Ref Range Status  07/24/2022 3.9 3.5 - 5.2 mmol/L Final         Failed - Cr in normal range and within 180 days    Creat  Date Value Ref Range Status  01/15/2017 0.73 0.50 - 1.05 mg/dL Final    Comment:    For patients >48 years of age, the reference limit for Creatinine is approximately 13% higher for people identified as African-American. .    Creatinine, Ser  Date Value Ref Range Status  07/24/2022 0.97 0.57 - 1.00 mg/dL Final         Failed - eGFR is 30 or above and within 180 days    GFR, Est African American  Date Value Ref Range Status  01/15/2017 111 > OR = 60 mL/min/1.56m2 Final   GFR calc Af Amer  Date Value Ref Range Status  11/04/2018 100 >59 mL/min/1.73 Final   GFR, Est Non African American  Date Value Ref Range Status  01/15/2017 95 > OR = 60 mL/min/1.68m2 Final   GFR calc non Af Amer  Date Value Ref Range Status  11/04/2018 87 >59 mL/min/1.73 Final   eGFR  Date Value Ref Range Status  07/24/2022 69 >59 mL/min/1.73 Final         Failed - Valid encounter within last 6 months    Recent Outpatient Visits   None            Passed - Patient is not pregnant      Passed - Last BP in normal range    BP Readings from  Last 1 Encounters:  10/14/22 107/80

## 2023-06-17 MED ORDER — LISINOPRIL-HYDROCHLOROTHIAZIDE 20-12.5 MG PO TABS: 1.0000 | ORAL_TABLET | Freq: Every day | ORAL | 0 refills | Status: DC

## 2023-10-14 ENCOUNTER — Other Ambulatory Visit: Payer: Self-pay | Admitting: Family Medicine

## 2023-10-14 DIAGNOSIS — I10 Essential (primary) hypertension: Secondary | ICD-10-CM

## 2023-10-14 NOTE — Telephone Encounter (Unsigned)
 Copied from CRM [id number]. Topic: Clinical - Medication Refill >> Oct 14, 2023  2:40 PM Charlet HERO wrote: Medication: amLODipine  (NORVASC ) 2.5 MG tablet lisinopril -hydrochlorothiazide  (ZESTORETIC ) 20-12.5 MG tablet Me   Has the patient contacted their pharmacy? No (No refills left on either  This is the patient's preferred pharmacy:    [facility] [address]), [place] - [address]) [place] [address] Phone: [phone] Fax: [phone]  Is this the correct pharmacy for this prescription? Yes If no, delete pharmacy and type the correct one.   Has the prescription been filled recently? Yes  Is the patient out of the medication? Yes  Has the patient been seen for an appointment in the last year OR does the patient have an upcoming appointment? Yes  Can we respond through MyChart? Yes  Agent: Please be advised that Rx refills may take up to 3 business days. We ask that you follow-up with your pharmacy.

## 2023-10-15 NOTE — Telephone Encounter (Signed)
 Requested medications are due for refill today.  yes  Requested medications are on the active medications list.  yes  Last refill. Amlodipine  02/19/2023 #90 0 rf, Zestoric 05/30/2023 #90 0 rf  Future visit scheduled.   yes  Notes to clinic.  Michelle Shea lasted as PCP. Labs are expired.    Requested Prescriptions  Pending Prescriptions Disp Refills   amLODipine  (NORVASC ) 2.5 MG tablet 90 tablet 0    Sig: Take 1 tablet (2.5 mg total) by mouth daily.     Cardiovascular: Calcium Channel Blockers 2 Failed - 10/15/2023  4:19 PM      Failed - Valid encounter within last 6 months    Recent Outpatient Visits   None            Passed - Last BP in normal range    BP Readings from Last 1 Encounters:  10/14/22 107/80         Passed - Last Heart Rate in normal range    Pulse Readings from Last 1 Encounters:  10/14/22 95          lisinopril -hydrochlorothiazide  (ZESTORETIC ) 20-12.5 MG tablet 90 tablet 0    Sig: Take 1 tablet by mouth daily.     Cardiovascular:  ACEI + Diuretic Combos Failed - 10/15/2023  4:19 PM      Failed - Na in normal range and within 180 days    Sodium  Date Value Ref Range Status  07/24/2022 145 (H) 134 - 144 mmol/L Final         Failed - K in normal range and within 180 days    Potassium  Date Value Ref Range Status  07/24/2022 3.9 3.5 - 5.2 mmol/L Final         Failed - Cr in normal range and within 180 days    Creat  Date Value Ref Range Status  01/15/2017 0.73 0.50 - 1.05 mg/dL Final    Comment:    For patients >53 years of age, the reference limit for Creatinine is approximately 13% higher for people identified as African-American. .    Creatinine, Ser  Date Value Ref Range Status  07/24/2022 0.97 0.57 - 1.00 mg/dL Final         Failed - eGFR is 30 or above and within 180 days    GFR, Est African American  Date Value Ref Range Status  01/15/2017 111 > OR = 60 mL/min/1.39m2 Final   GFR calc Af Amer  Date Value Ref Range Status   11/04/2018 100 >59 mL/min/1.73 Final   GFR, Est Non African American  Date Value Ref Range Status  01/15/2017 95 > OR = 60 mL/min/1.9m2 Final   GFR calc non Af Amer  Date Value Ref Range Status  11/04/2018 87 >59 mL/min/1.73 Final   eGFR  Date Value Ref Range Status  07/24/2022 69 >59 mL/min/1.73 Final         Failed - Valid encounter within last 6 months    Recent Outpatient Visits   None            Passed - Patient is not pregnant      Passed - Last BP in normal range    BP Readings from Last 1 Encounters:  10/14/22 107/80

## 2023-10-16 MED ORDER — LISINOPRIL-HYDROCHLOROTHIAZIDE 20-12.5 MG PO TABS: 1.0000 | ORAL_TABLET | Freq: Every day | ORAL | 0 refills | Status: AC

## 2023-10-16 MED ORDER — AMLODIPINE BESYLATE 2.5 MG PO TABS: 2.5000 mg | ORAL_TABLET | Freq: Every day | ORAL | 0 refills | Status: AC

## 2023-10-22 ENCOUNTER — Ambulatory Visit: Admitting: Physician Assistant

## 2023-11-13 ENCOUNTER — Ambulatory Visit: Admitting: Physician Assistant
